# Patient Record
Sex: Female | Born: 1975
Health system: Southern US, Community
[De-identification: ages and names within clinical notes are randomized; demographics above are authoritative.]

## PROBLEM LIST (undated history)

## (undated) DIAGNOSIS — K529 Noninfective gastroenteritis and colitis, unspecified: Secondary | ICD-10-CM

## (undated) DIAGNOSIS — Z803 Family history of malignant neoplasm of breast: Secondary | ICD-10-CM

## (undated) DIAGNOSIS — Z800 Family history of malignant neoplasm of digestive organs: Secondary | ICD-10-CM

## (undated) DIAGNOSIS — T7840XA Allergy, unspecified, initial encounter: Secondary | ICD-10-CM

## (undated) DIAGNOSIS — G4730 Sleep apnea, unspecified: Secondary | ICD-10-CM

## (undated) HISTORY — PX: TUBAL LIGATION: SHX77

## (undated) HISTORY — DX: Allergy, unspecified, initial encounter: T78.40XA

## (undated) HISTORY — DX: Sleep apnea, unspecified: G47.30

---
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
## 1998-07-13 ENCOUNTER — Encounter: Payer: Self-pay | Admitting: *Deleted

## 1998-07-13 ENCOUNTER — Observation Stay (HOSPITAL_COMMUNITY): Admission: AD | Admit: 1998-07-13 | Discharge: 1998-07-14 | Payer: Self-pay | Admitting: *Deleted

## 1998-07-14 ENCOUNTER — Encounter: Payer: Self-pay | Admitting: *Deleted

## 1998-07-16 ENCOUNTER — Inpatient Hospital Stay (HOSPITAL_COMMUNITY): Admission: AD | Admit: 1998-07-16 | Discharge: 1998-07-16 | Payer: Self-pay | Admitting: *Deleted

## 2004-12-22 ENCOUNTER — Encounter: Admission: RE | Admit: 2004-12-22 | Discharge: 2004-12-22 | Payer: Self-pay | Admitting: Obstetrics & Gynecology

## 2005-12-18 ENCOUNTER — Inpatient Hospital Stay (HOSPITAL_COMMUNITY): Admission: AD | Admit: 2005-12-18 | Discharge: 2005-12-22 | Payer: Self-pay | Admitting: Obstetrics and Gynecology

## 2005-12-23 ENCOUNTER — Encounter: Admission: RE | Admit: 2005-12-23 | Discharge: 2006-01-22 | Payer: Self-pay | Admitting: Obstetrics and Gynecology

## 2006-01-23 ENCOUNTER — Encounter: Admission: RE | Admit: 2006-01-23 | Discharge: 2006-01-27 | Payer: Self-pay | Admitting: Obstetrics and Gynecology

## 2006-01-28 ENCOUNTER — Other Ambulatory Visit: Admission: RE | Admit: 2006-01-28 | Discharge: 2006-01-28 | Payer: Self-pay | Admitting: Obstetrics and Gynecology

## 2008-12-17 ENCOUNTER — Inpatient Hospital Stay (HOSPITAL_COMMUNITY): Admission: AD | Admit: 2008-12-17 | Discharge: 2008-12-17 | Payer: Self-pay | Admitting: Obstetrics and Gynecology

## 2008-12-31 ENCOUNTER — Inpatient Hospital Stay (HOSPITAL_COMMUNITY): Admission: AD | Admit: 2008-12-31 | Discharge: 2008-12-31 | Payer: Self-pay | Admitting: Obstetrics and Gynecology

## 2009-01-03 ENCOUNTER — Inpatient Hospital Stay (HOSPITAL_COMMUNITY): Admission: AD | Admit: 2009-01-03 | Discharge: 2009-01-06 | Payer: Self-pay | Admitting: Obstetrics and Gynecology

## 2009-01-04 ENCOUNTER — Encounter (INDEPENDENT_AMBULATORY_CARE_PROVIDER_SITE_OTHER): Payer: Self-pay | Admitting: Obstetrics and Gynecology

## 2010-02-21 ENCOUNTER — Encounter: Admission: RE | Admit: 2010-02-21 | Discharge: 2010-02-21 | Payer: Self-pay | Admitting: Family Medicine

## 2010-04-25 ENCOUNTER — Encounter: Admission: RE | Admit: 2010-04-25 | Discharge: 2010-04-25 | Payer: Self-pay | Admitting: Otolaryngology

## 2010-08-30 LAB — CBC
HCT: 35.8 % — ABNORMAL LOW (ref 36.0–46.0)
HCT: 36.7 % (ref 36.0–46.0)
Hemoglobin: 11.9 g/dL — ABNORMAL LOW (ref 12.0–15.0)
Hemoglobin: 12.3 g/dL (ref 12.0–15.0)
MCHC: 34.6 g/dL (ref 30.0–36.0)
MCHC: 35.5 g/dL (ref 30.0–36.0)
MCV: 92.7 fL (ref 78.0–100.0)
Platelets: 210 10*3/uL (ref 150–400)
RBC: 3.66 MIL/uL — ABNORMAL LOW (ref 3.87–5.11)
RBC: 3.81 MIL/uL — ABNORMAL LOW (ref 3.87–5.11)
RBC: 3.89 MIL/uL (ref 3.87–5.11)
RDW: 13.4 % (ref 11.5–15.5)
RDW: 13.6 % (ref 11.5–15.5)
RDW: 13.6 % (ref 11.5–15.5)
WBC: 14.1 10*3/uL — ABNORMAL HIGH (ref 4.0–10.5)

## 2010-08-30 LAB — URINALYSIS, ROUTINE W REFLEX MICROSCOPIC
Bilirubin Urine: NEGATIVE
Bilirubin Urine: NEGATIVE
Glucose, UA: NEGATIVE mg/dL
Hgb urine dipstick: NEGATIVE
Hgb urine dipstick: NEGATIVE
Ketones, ur: NEGATIVE mg/dL
Ketones, ur: NEGATIVE mg/dL
Nitrite: NEGATIVE
Nitrite: NEGATIVE
Protein, ur: NEGATIVE mg/dL
Protein, ur: NEGATIVE mg/dL
Specific Gravity, Urine: 1.01 (ref 1.005–1.030)
Urobilinogen, UA: 0.2 mg/dL (ref 0.0–1.0)
Urobilinogen, UA: 0.2 mg/dL (ref 0.0–1.0)
pH: 6 (ref 5.0–8.0)

## 2010-08-30 LAB — COMPREHENSIVE METABOLIC PANEL
ALT: 17 U/L (ref 0–35)
ALT: 19 U/L (ref 0–35)
AST: 19 U/L (ref 0–37)
Albumin: 2.9 g/dL — ABNORMAL LOW (ref 3.5–5.2)
Alkaline Phosphatase: 110 U/L (ref 39–117)
Alkaline Phosphatase: 112 U/L (ref 39–117)
BUN: 7 mg/dL (ref 6–23)
BUN: 8 mg/dL (ref 6–23)
CO2: 21 mEq/L (ref 19–32)
CO2: 23 mEq/L (ref 19–32)
Calcium: 8.7 mg/dL (ref 8.4–10.5)
Calcium: 9.6 mg/dL (ref 8.4–10.5)
Chloride: 107 mEq/L (ref 96–112)
Creatinine, Ser: 0.55 mg/dL (ref 0.4–1.2)
GFR calc Af Amer: >60 mL/min (ref >60)
GFR calc non Af Amer: >60 mL/min (ref >60)
GFR calc non Af Amer: >60 mL/min (ref >60)
Glucose, Bld: 76 mg/dL (ref 70–99)
Glucose, Bld: 79 mg/dL (ref 70–99)
Potassium: 3.6 mEq/L (ref 3.5–5.1)
Potassium: 3.6 mEq/L (ref 3.5–5.1)
Sodium: 137 mEq/L (ref 135–145)
Sodium: 138 mEq/L (ref 135–145)
Sodium: 138 mEq/L (ref 135–145)
Total Bilirubin: 0.3 mg/dL (ref 0.3–1.2)
Total Protein: 5.5 g/dL — ABNORMAL LOW (ref 6.0–8.3)
Total Protein: 5.7 g/dL — ABNORMAL LOW (ref 6.0–8.3)
Total Protein: 5.8 g/dL — ABNORMAL LOW (ref 6.0–8.3)

## 2010-08-30 LAB — URIC ACID
Uric Acid, Serum: 6.4 mg/dL (ref 2.4–7.0)
Uric Acid, Serum: 6.5 mg/dL (ref 2.4–7.0)

## 2010-08-30 LAB — LACTATE DEHYDROGENASE: LDH: 108 U/L (ref 94–250)

## 2010-08-30 LAB — RPR: RPR Ser Ql: NONREACTIVE

## 2010-08-30 LAB — URINE MICROSCOPIC-ADD ON

## 2010-08-30 LAB — CCBB MATERNAL DONOR DRAW

## 2010-08-31 LAB — URINALYSIS, ROUTINE W REFLEX MICROSCOPIC
Glucose, UA: NEGATIVE mg/dL
Leukocytes, UA: NEGATIVE
Nitrite: NEGATIVE
Specific Gravity, Urine: 1.02 (ref 1.005–1.030)
pH: 7 (ref 5.0–8.0)

## 2010-08-31 LAB — URINE MICROSCOPIC-ADD ON

## 2010-08-31 LAB — LACTATE DEHYDROGENASE: LDH: 216 U/L (ref 94–250)

## 2010-08-31 LAB — COMPREHENSIVE METABOLIC PANEL
ALT: 22 U/L (ref 0–35)
AST: 24 U/L (ref 0–37)
Albumin: 2.8 g/dL — ABNORMAL LOW (ref 3.5–5.2)
Calcium: 9.1 mg/dL (ref 8.4–10.5)
GFR calc Af Amer: >60 mL/min (ref >60)
Glucose, Bld: 120 mg/dL — ABNORMAL HIGH (ref 70–99)
Sodium: 138 mEq/L (ref 135–145)
Total Protein: 5.7 g/dL — ABNORMAL LOW (ref 6.0–8.3)

## 2010-08-31 LAB — CBC
MCHC: 35.4 g/dL (ref 30.0–36.0)
Platelets: 238 10*3/uL (ref 150–400)
RDW: 13.4 % (ref 11.5–15.5)

## 2010-10-07 NOTE — Op Note (Signed)
[name], [name]               ACCOUNT NO.:  [id number]   MEDICAL RECORD NO.:  [id number]          PATIENT TYPE:  INP   LOCATION:  NA                            FACILITY:  [facility]   PHYSICIAN:  Leighton Roach Meisinger, M.D.DATE OF BIRTH:  12-17-1975   DATE OF PROCEDURE:  01/04/2009  DATE OF DISCHARGE:                               OPERATIVE REPORT   PREOPERATIVE DIAGNOSES:  Intrauterine pregnancy at 38 plus weeks,  gestational hypertension, previous cesarean section, and desires  surgical sterility.   POSTOPERATIVE DIAGNOSES:  Intrauterine pregnancy at 38 plus weeks,  gestational hypertension, previous cesarean section, and desires  surgical sterility.   PROCEDURE:  Repeat low transverse cesarean section and bilateral partial  salpingectomy.   SURGEON:  Zenaida Niece, MD   ANESTHESIA:  Spinal.   FINDINGS:  The patient had normal gravid and anatomy, delivered a viable  female infant with Apgars of 8 and 9, weight 7 pounds 0 ounces.   SPECIMENS:  Placenta sent for cord blood donation and portions of  bilateral fallopian tubes went to Pathology.   ESTIMATED BLOOD LOSS:  800 mL.   COMPLICATIONS:  None.   PROCEDURE IN DETAIL:  The patient was taken to the operating room and  placed in the sitting position.  Dr. Malen Gauze instilled spinal anesthesia.  She was then placed in the dorsal supine position with a left lateral  tilt.  Abdomen was prepped and draped in the usual sterile fashion and a  Foley catheter was placed.  The level of her anesthesia was found to be  adequate.  Abdomen was entered via a standard Pfannenstiel incision  through her previous scar.  Once the uterus was exposed, the Alexis  disposable self-retaining retractor was placed.  A 4-cm transverse  incision was then made in the lower uterine segment pushing the bladder  inferior.  Once the uterine cavity was entered, the incision was  extended digitally.  Membranes were then ruptured revealing clear fluid.  The  fetal vertex was grasped and delivered through the incision  atraumatically.  Mouth and nares were suctioned.  The remainder of the  infant then delivered atraumatically.  Cord was doubly clamped and cut,  and the infant handed to the awaiting pediatric team.  Placenta  delivered spontaneously and was sent for cord blood donation.  Uterus  was wiped dry with a clean lap pad and all clots and debris were  removed.  Uterine incision was inspected and found to be free of  extensions.  Uterine incision was closed in one layer with a running  locking layer with #1 chromic with adequate closure and adequate  hemostasis.  Bleeding from the serosal edges was controlled with  electrocautery.   Attention was turned to tubal ligation.  Both fallopian tubes were  identified and traced to their fimbriated ends.  The middle portion of  each fallopian tube was elevated with a Babcock clamp.  A window was  made in an avascular portion of the mesosalpinx.  Zero plain gut suture  was passed through the window, tied on one side, and then wrapped on the  other side of the tube to make a knuckle of tube.  The knuckle of tube  was then removed sharply.  On both sides, both tubal ostia were  identified and the stumps were hemostatic.  Uterine incision was again  inspected and found to be free of extensions.  The Alexis retractor was  removed.  The subfascial space was irrigated and made hemostatic with  electrocautery.  Fascia was closed in running fashion starting at both  ends and meeting in the middle with 0 Vicryl.  Subcutaneous tissue was  then irrigated and made hemostatic with electrocautery.  Subcutaneous  tissue was closed with running 2-0 plain gut suture.  Skin was then  closed with staples followed by a sterile dressing.  The patient  tolerated the procedure well and was taken to the recovery room in  stable condition.  Counts were correct.  She received Ancef 1 g IV at  the beginning of the  procedure and had PAS hose on throughout the  procedure.      Zenaida Niece, M.D.  Electronically Signed     TDM/MEDQ  D:  01/04/2009  T:  01/04/2009  Job:  [id number]

## 2010-10-07 NOTE — Discharge Summary (Signed)
[name], [name]               ACCOUNT NO.:  [id number]   MEDICAL RECORD NO.:  [id number]          PATIENT TYPE:  INP   LOCATION:  9144                          FACILITY:  [facility]   PHYSICIAN:  Zenaida Niece, M.D.DATE OF BIRTH:  10/25/1975   DATE OF ADMISSION:  01/03/2009  DATE OF DISCHARGE:  01/06/2009                               DISCHARGE SUMMARY   ADMISSION DIAGNOSES:  1. Intrauterine pregnancy at 38+ weeks.  2. Gestational hypertension.  3. Previous cesarean section and desires surgical sterility.   DISCHARGE DIAGNOSES:  1. Intrauterine pregnancy at 38+ weeks.  2. Gestational hypertension.  3. Previous cesarean section and desires surgical sterility.   PROCEDURES:  On January 04, 2009, she underwent repeat low-transverse  cesarean section and bilateral tubal ligation.   HISTORY AND PHYSICAL:  Please see the chart for the full dictated  history and physical.  Briefly, this is a 35 year old gravida 2, para 1-  0-0-1 with an EGA of 38+ weeks with persistently elevated blood  pressures in the office who is being admitted for repeat cesarean  section.  Prenatal care has otherwise been uncomplicated except for  history of a prior cesarean section for which she is scheduled for  repeat.  She also wants her tubes tied.  Physical exam significant for  blood pressures in the office of 150/110.  Abdomen is gravid, nontender  with fundal height of 39 cm and well-healed transverse scar.  Cervix is  1, 50, -3 and vertex.   HOSPITAL COURSE:  The patient was admitted on the evening of August 12.  PIH labs were normal and no significant proteinuria.  NST was reactive.  Blood pressures remained labile 130-150/80-100.  On the late morning of  August 13, she was taken to the operating room for repeat cesarean  section and tubal ligation.  This was done under spinal anesthesia  without complications.  She delivered a viable female infant with Apgars  of 8 and 9 and weighed 7 pounds.   Estimated blood loss was 800 mL.  Postoperatively, she had no significant complications.  Blood pressure  remained labile on postoperative #1 at 130-160/70s to 90s.  Predelivery  hemoglobin 12.5, postdelivery 11.9.  On postoperative day #2, blood  pressures were stable.  She was otherwise doing well and she requested  discharge home.  She was felt to be stable enough for discharge home.  Her incision was healing well.   DISCHARGE INSTRUCTIONS:  Regular diet, pelvic rest, no strenuous  activity.  Followup is in 2-3 days for staple removal.  Medications are  Percocet #40 1-2 p.o. q.4-6 hours p.r.n. pain and over-the-counter  ibuprofen as needed and she was given our discharge pamphlet.     Zenaida Niece, M.D.  Electronically Signed    TDM/MEDQ  D:  01/06/2009  T:  01/06/2009  Job:  [id number]

## 2010-10-07 NOTE — H&P (Signed)
[name], HAUSNER               ACCOUNT NO.:  [id number]   MEDICAL RECORD NO.:  [id number]          PATIENT TYPE:  INP   LOCATION:  9175                          FACILITY:  [facility]   PHYSICIAN:  Zenaida Niece, M.D.DATE OF BIRTH:  04/03/1976   DATE OF ADMISSION:  01/03/2009  DATE OF DISCHARGE:                              HISTORY & PHYSICAL   CHIEF COMPLAINT:  Gestational hypertension.   HISTORY OF PRESENT ILLNESS:  This is a 35 year old gravida 2, para 1-0-0-  1 with an EGA of 38+ weeks by an LMP consistent with a 7-week ultrasound  with a due date of January 16, 2009 who presents to the office today with  complaint of mild headache and increased swelling.  She has been  followed for elevated blood pressures with normal labs.  However, she  feels worse today and her edema is worse.  She was evaluated in  maternity admissions earlier in this week.  Apparently, she had a 24-  hour urine collection done, which she turned in yesterday morning, which  we do not have results of yet.  She has a prior cesarean section and is  scheduled for repeat cesarean section on January 11, 2009.  However, due  to her blood pressure of 152/110 in the office today and repeated  elevated blood pressures in the office, I do not feel she is going to  make it that far.  She is being admitted for lab evaluation, nonstress  test, and evaluation of her blood pressures in preparation for cesarean  section probably on the January 04, 2009.  Prenatal care has been  otherwise uncomplicated.  Prenatal labs, RPR is nonreactive, hepatitis B  surface antigen negative, rubella immune, HIV negative, blood type is O+  with negative antibody screen.  Gonorrhea and chlamydia are negative.  Cystic fibrosis negative.  Group B strep is negative.  A 1-hour Glucola  is 103.   PAST OBSTETRICAL HISTORY:  In 2007 low transverse cesarean section at 40  weeks done for arrest of descent.  Baby weighed 8 pounds 2 ounces.   PAST  GYNECOLOGICAL HISTORY:  History of a low grade SIL Pap smear with  normal colposcopy and normal since then.   PAST MEDICAL HISTORY:  Negative.   PAST SURGICAL HISTORY:  Significant only for the cesarean section.   ALLERGIES:  To SULFA, which causes rash, hives, and edema.   CURRENT MEDICATIONS:  None.   SOCIAL HISTORY:  She is married and did smoke prior to pregnancy, but  not during pregnancy.   FAMILY HISTORY:  Significant for history of breast cancer in her mother   REVIEW OF SYSTEMS:  She does have a mild headache, but no vision change  upper abdominal pain.  She does have increased swelling and no  significant nausea or vomiting.   PHYSICAL EXAMINATION:  GENERAL:  This is a well-developed gravid female,  in no acute distress.  VITAL SIGNS:  Blood pressure in the office was 152/110.  Urine protein  is negative in the office.  NECK:  Supple without lymphadenopathy or thyromegaly.  LUNGS:  Clear to  auscultation.  HEART:  Regular rate and rhythm without murmur.  ABDOMEN:  Gravid, nontender with a fundal height of 39 cm and she has a  well-healed transverse scar.  EXTREMITIES:  2+ edema.  DTRs are 1+/4 and she has no clonus.  Cervix is 1, 50, -3 and vertex presentation.   ASSESSMENT:  Intrauterine pregnancy at 38+ weeks.  1. Previous cesarean section.  The patient is cleared for a trial of      labor, but declines this and wants repeat cesarean section.  This      is scheduled for January 11, 2009, but will be done earlier due to      gestational hypertension.  2. Gestational hypertension/possible preeclampsia.  Blood pressures in      the office remained elevated, although her labs to this point have      not been significantly abnormal.  She has a 24-hour urine result      that is pending.   PLAN:  To admit the patient to Antenatal or Labor and Delivery for  bedrest and check serial blood pressures, labs and an NST.  If she is  stable tonight then I will do her C-section  tomorrow on the January 04, 2009.  If anything is significantly abnormal tonight then we will  proceed with C-section tonight.      Zenaida Niece, M.D.  Electronically Signed     TDM/MEDQ  D:  01/03/2009  T:  01/04/2009  Job:  [id number]

## 2010-10-10 NOTE — Op Note (Signed)
[name], [name]               ACCOUNT NO.:  [id number]   MEDICAL RECORD NO.:  [id number]          PATIENT TYPE:  INP   LOCATION:  9173                          FACILITY:  [facility]   PHYSICIAN:  Zenaida Niece, M.D.DATE OF BIRTH:  [birth year]   DATE OF PROCEDURE:  12/19/2005  DATE OF DISCHARGE:                                 OPERATIVE REPORT   PREOPERATIVE DIAGNOSES:  Intrauterine pregnancy at 39 weeks and arrest of  descent.   POSTOPERATIVE DIAGNOSES:  Intrauterine pregnancy at 39 weeks and arrest of  descent.   PROCEDURE:  Primary low transverse cesarean section.   SURGEON:  Zenaida Niece, M.D.   ANESTHESIA:  Epidural.   ESTIMATED BLOOD LOSS:  1000 cc.   COMPLICATIONS:  None.   FINDINGS:  She had normal anatomy and delivered a viable female infant, that  was in the OP position and had Apgars of 3 and 9; that weighed  8 pounds 3  ounces, and there was a nuchal cord x2.   PROCEDURE IN DETAIL:  The patient was taken to the operating room and placed  in the dorsal supine position with a left lateral tilt.  Her previously  placed epidural was dosed appropriately.  Abdomen was prepped and draped in  the usual sterile fashion.  A Foley catheter had previously been placed.  The level of her anesthesia was found to be adequate and the abdomen was  entered via a standard Pfannenstiel's incision.  A disposable self-retaining  retractor was placed and a 4 cm transverse incision was made in the lower  uterine segment.  The incision was extended bilaterally digitally.  The  fetal vertex was grasped and moved up out of the pelvis, and delivered  through the incision atraumatically.  Mouth and nares were suctioned.  The  remainder of the infant then delivered atraumatically.  The cord was doubly  clamped and cut, and the infant handed to the awaiting pediatric team.  Cord  blood was obtained and the placenta delivered spontaneously.  The uterus was  wiped dry with a clean lap pad,  and all clots and debris were removed.  The  uterine incision was inspected and found to be free of extensions.  The  uterine incision was closed in two layers, with the first layer being  running locking layer with #1 chromic, and the second layer being an  imbricating layer with #1 chromic.  A small amount of bleeding from a suture  hole on the right side was controlled with 3-0 Vicryl.  Tubes and ovaries  were inspected and found to be normal.  The uterine incision was again  inspected and found to be hemostatic.  Subfascial space was then irrigated  and made hemostatic with electrocautery.  Fascia was closed in a running  fashion, starting at both ends and meeting in the middle with 0  Vicryl.  Subcutaneous tissue was then irrigated and made hemostatic with  electrocautery.  The skin was then closed with staples, followed by a  sterile dressing.  The patient tolerated the procedure well and was taken to  the recovery  room in stable condition.  Counts were correct and she received  Ancef  1 gram after cord clamp.      Zenaida Niece, M.D.  Electronically Signed     TDM/MEDQ  D:  12/19/2005  T:  12/19/2005  Job:  [id number]

## 2010-10-10 NOTE — Discharge Summary (Signed)
[name], LABARBERA               ACCOUNT NO.:  [id number]   MEDICAL RECORD NO.:  [id number]          PATIENT TYPE:  INP   LOCATION:  9123                          [facility]ILITY[facility]   PHYSICIAN:  Zenaida Niece, M.D.DATE OF BIRTH:  [birth year]   DATE OF ADMISSION:  12/18/2005  DATE OF DISCHARGE:  12/22/2005                                 DISCHARGE SUMMARY   ADMISSION DIAGNOSIS:  Intrauterine pregnancy at 40 weeks.   DISCHARGE DIAGNOSES:  1. Intrauterine pregnancy at 40 weeks.  2. Arrest of descent.   PROCEDURES:  On July 28 she had a primary low transverse cesarean section.   HISTORY AND PHYSICAL:  This is a 35 year old white female gravida 1, para 0  with an EGA of [redacted] weeks who presents for elective induction.  Prenatal care  uncomplicated.   PRENATAL LABORATORIES:  Blood type is O+ with a negative antibody screen,  RPR nonreactive, rubella immune, hepatitis B surface antigen negative,  gonorrhea and chlamydia negative, one-hour Glucola 121, group B Strep is  negative.   Past history is essentially noncontributory.   PHYSICAL EXAMINATION:  GENERAL:  She is afebrile with stable vital signs.  Fetal heart tracing is reactive with irregular contractions.  ABDOMEN:  Gravid, nontender with an estimated fetal weight of 8 pounds.  PELVIC:  Cervix is 2-3, 80, -1 vertex presentation, and an adequate pelvis.   HOSPITAL COURSE:  Patient was admitted and was contracting on her own.  She  had spontaneous rupture of membranes and progressed in active labor.  She  received an epidural and continued to progress.  However, she did have a  protracted course and reached a rim dilated.  She was able to push past  this, but was not able to bring the baby past +2 station.  Thus, on the  afternoon of July 28 she was taken for a primary low transverse cesarean  section under epidural anesthesia.  Estimated blood loss was 1000 mL.  The  patient had normal anatomy and delivered a viable female infant  that was in  the direct OP position and had a nuchal cord x2.  Apgars were 3 and 9 and  weight was 8 pounds 2 ounces.  Postoperatively she had no significant  complications.  Pre delivery hemoglobin 13.3 and post delivery 9.4.  On  postoperative day #3 she was felt to be stable enough for discharge home.  Her incision was healing well and prior to discharge her staples were to be  removed and Steri-Strips applied.   DISCHARGE INSTRUCTIONS:  Regular diet, pelvic rest, no strenuous activity.  Follow-up is in two weeks for an incision check.  Medications are Percocet  #40 one to two p.o. q.4-6h. p.r.n. pain and over-the-counter ibuprofen as  needed and she is given our discharge pamphlet.      Zenaida Niece, M.D.  Electronically Signed     TDM/MEDQ  D:  12/22/2005  T:  12/22/2005  Job:  [id number]

## 2014-11-05 DIAGNOSIS — Z9889 Other specified postprocedural states: Secondary | ICD-10-CM | POA: Insufficient documentation

## 2014-11-29 ENCOUNTER — Other Ambulatory Visit: Payer: Self-pay | Admitting: General Practice

## 2014-12-24 HISTORY — PX: BREAST BIOPSY: SHX20

## 2014-12-25 ENCOUNTER — Other Ambulatory Visit: Payer: Self-pay | Admitting: Obstetrics and Gynecology

## 2014-12-25 DIAGNOSIS — R928 Other abnormal and inconclusive findings on diagnostic imaging of breast: Secondary | ICD-10-CM

## 2014-12-27 ENCOUNTER — Ambulatory Visit
Admission: RE | Admit: 2014-12-27 | Discharge: 2014-12-27 | Disposition: A | Discharge status: Home / Self Care | Payer: BLUE CROSS/BLUE SHIELD | Source: Ambulatory Visit | Attending: Obstetrics and Gynecology | Admitting: Obstetrics and Gynecology

## 2014-12-27 ENCOUNTER — Other Ambulatory Visit: Payer: Self-pay | Admitting: Obstetrics and Gynecology

## 2014-12-27 DIAGNOSIS — R928 Other abnormal and inconclusive findings on diagnostic imaging of breast: Secondary | ICD-10-CM

## 2014-12-27 DIAGNOSIS — R921 Mammographic calcification found on diagnostic imaging of breast: Secondary | ICD-10-CM

## 2015-01-01 ENCOUNTER — Ambulatory Visit
Admission: RE | Admit: 2015-01-01 | Discharge: 2015-01-01 | Disposition: A | Discharge status: Home / Self Care | Payer: BLUE CROSS/BLUE SHIELD | Source: Ambulatory Visit | Attending: Obstetrics and Gynecology | Admitting: Obstetrics and Gynecology

## 2015-01-01 ENCOUNTER — Other Ambulatory Visit: Payer: Self-pay | Admitting: Obstetrics and Gynecology

## 2015-01-01 DIAGNOSIS — R921 Mammographic calcification found on diagnostic imaging of breast: Secondary | ICD-10-CM

## 2015-01-02 ENCOUNTER — Other Ambulatory Visit: Payer: Self-pay | Admitting: Obstetrics and Gynecology

## 2015-01-02 DIAGNOSIS — R921 Mammographic calcification found on diagnostic imaging of breast: Secondary | ICD-10-CM

## 2015-03-25 ENCOUNTER — Other Ambulatory Visit: Payer: Self-pay | Admitting: General Practice

## 2015-05-27 ENCOUNTER — Emergency Department (INDEPENDENT_AMBULATORY_CARE_PROVIDER_SITE_OTHER)
Admission: EM | Admit: 2015-05-27 | Discharge: 2015-05-27 | Disposition: A | Discharge status: Home / Self Care | Payer: BLUE CROSS/BLUE SHIELD | Source: Home / Self Care | Attending: Family Medicine | Admitting: Family Medicine

## 2015-05-27 ENCOUNTER — Emergency Department (INDEPENDENT_AMBULATORY_CARE_PROVIDER_SITE_OTHER): Payer: BLUE CROSS/BLUE SHIELD

## 2015-05-27 ENCOUNTER — Encounter: Payer: Self-pay | Admitting: *Deleted

## 2015-05-27 DIAGNOSIS — F17200 Nicotine dependence, unspecified, uncomplicated: Secondary | ICD-10-CM | POA: Diagnosis not present

## 2015-05-27 DIAGNOSIS — M940 Chondrocostal junction syndrome [Tietze]: Secondary | ICD-10-CM

## 2015-05-27 DIAGNOSIS — M791 Myalgia: Secondary | ICD-10-CM | POA: Diagnosis not present

## 2015-05-27 DIAGNOSIS — R079 Chest pain, unspecified: Secondary | ICD-10-CM | POA: Diagnosis not present

## 2015-05-27 DIAGNOSIS — M7918 Myalgia, other site: Secondary | ICD-10-CM

## 2015-05-27 DIAGNOSIS — R0789 Other chest pain: Secondary | ICD-10-CM | POA: Diagnosis not present

## 2015-05-27 HISTORY — DX: Noninfective gastroenteritis and colitis, unspecified: K52.9

## 2015-05-27 MED ORDER — KETOROLAC TROMETHAMINE 60 MG/2ML IM SOLN
60.0000 mg | Freq: Once | INTRAMUSCULAR | Status: AC
  Administered 2015-05-27: 60 mg via INTRAMUSCULAR

## 2015-05-27 MED ORDER — HYDROCODONE-ACETAMINOPHEN 5-325 MG PO TABS: ORAL_TABLET | ORAL | Status: DC

## 2015-05-27 MED ORDER — MELOXICAM 15 MG PO TABS: 15.0000 mg | ORAL_TABLET | Freq: Every day | ORAL | Status: DC

## 2015-05-27 NOTE — ED Notes (Signed)
Since last night, c/o CP and fluttering. Mild stomach discomfort. H/o colitis. CP not relieved by Ranitidine.

## 2015-05-27 NOTE — ED Provider Notes (Signed)
CSN: [id number]     Arrival date & time 05/27/15  1248 History   First MD Initiated Contact with Patient 05/27/15 1328     Chief Complaint  Patient presents with  . Chest Pain      HPI Comments: 48 hours ago patient developed a vague "heaviness" in her anterior chest that began to radiate to her upper mid-back between scapulae.   The pain became worse yesterday with a sensation of pressure and tightness in her anterior chest.  She had no improvement after taking ranitidine and Nexium.  No nausea/vomiting.  No cough or recent URI symptoms.   Family history of MI paternal aunt and uncle, both age mid-70  Patient is a 40 y.o. female presenting with chest pain. The history is provided by the patient.  Chest Pain Pain location:  Substernal area Pain quality: aching and pressure   Pain radiates to:  Upper back Pain radiates to the back: yes   Pain severity:  Mild Onset quality:  Gradual Duration:  2 days Timing:  Constant Progression:  Unchanged Chronicity:  New Context: movement   Relieved by:  Nothing Worsened by:  Certain positions Ineffective treatments: ranitidine. Associated symptoms: abdominal pain, back pain and palpitations   Associated symptoms: no AICD problem, no anorexia, no cough, no diaphoresis, no dizziness, no dysphagia, no fatigue, no fever, no headache, no heartburn, no lower extremity edema, no nausea, no numbness, no PND, no shortness of breath, no syncope and not vomiting   Risk factors: smoking     Past Medical History  Diagnosis Date  . Colitis    Past Surgical History  Procedure Laterality Date  . Cesarean section     Family History  Problem Relation Age of Onset  . Heart attack Other    Social History  Substance Use Topics  . Smoking status: Current Every Day Smoker    Types: Cigarettes  . Smokeless tobacco: Never Used  . Alcohol Use: Yes   OB History    No data available     Review of Systems  Constitutional: Negative for fever, diaphoresis  and fatigue.  HENT: Negative for trouble swallowing.   Respiratory: Negative for cough and shortness of breath.   Cardiovascular: Positive for chest pain and palpitations. Negative for syncope and PND.  Gastrointestinal: Positive for abdominal pain. Negative for heartburn, nausea, vomiting and anorexia.  Musculoskeletal: Positive for back pain.  Neurological: Negative for dizziness, numbness and headaches.  All other systems reviewed and are negative.   Allergies  Sulfur  Home Medications   Prior to Admission medications   Medication Sig Start Date End Date Taking? Authorizing Provider  dicyclomine (BENTYL) 20 MG tablet Take 20 mg by mouth every 6 (six) hours.   Yes Historical Provider, MD  ranitidine (ZANTAC) 150 MG tablet Take 150 mg by mouth 2 (two) times daily.   Yes Historical Provider, MD  HYDROcodone-acetaminophen (NORCO/VICODIN) 5-325 MG tablet Take one by mouth at bedtime as needed for pain 05/27/15   Kandra Nicolas, MD  meloxicam (MOBIC) 15 MG tablet Take 1 tablet (15 mg total) by mouth daily. Take with food each morning 05/27/15   Kandra Nicolas, MD   Meds Ordered and Administered this Visit   Medications  ketorolac (TORADOL) injection 60 mg (60 mg Intramuscular Given 05/27/15 1353)    BP 163/104 mmHg  Pulse 89  Temp(Src) 98.2 F (36.8 C) (Oral)  Resp 18  Wt 186 lb (84.369 kg)  SpO2 99%  LMP 05/13/2015 No data  found.   Physical Exam  Constitutional: She is oriented to person, place, and time. She appears well-developed and well-nourished. No distress.  HENT:  Head: Normocephalic.  Right Ear: External ear normal.  Left Ear: External ear normal.  Nose: Nose normal.  Mouth/Throat: No oropharyngeal exudate.  Eyes: Conjunctivae are normal. Pupils are equal, round, and reactive to light.  Neck: Neck supple.  Cardiovascular: Normal rate, regular rhythm, normal heart sounds and intact distal pulses.   Pulmonary/Chest: Effort normal and breath sounds normal. She has no  wheezes. She has no rales.   She exhibits tenderness.    Chest:  Distinct tenderness to palpation over the mid-sternum and pectoralis muscles.  Her pain is recreated by palpating her sternum and pectoralis muscles during resisted contraction of the pectoralis muscles.  There is distinct tenderness over medial edge of left scapula.  Pain is elicited by resisted abduction of left shoulder while palpating left rhomboid muscles.  There is milder tenderness along the medial edge of the right scapula.  Abdominal: Soft. Bowel sounds are normal. There is no tenderness.  Musculoskeletal: She exhibits no edema.  Lymphadenopathy:    She has no cervical adenopathy.  Neurological: She is alert and oriented to person, place, and time.  Skin: Skin is warm and dry. No rash noted. She is not diaphoretic.  Nursing note and vitals reviewed.   ED Course  Procedures none    Labs Reviewed -    EKG: Rate:  70 BPM PR:  128 msec QT:  408 msec QTcH:  440 msec QRSD:  98 msec QRS axis:  62 degrees Interpretation:  Normal sinus rhythm; within normal limits.  No acute changes   Imaging Review Dg Chest 2 View  05/27/2015  CLINICAL DATA:  Pressure and tightness for 3 days.  Smoker. EXAM: CHEST  2 VIEW COMPARISON:  None. FINDINGS: The heart size and mediastinal contours are within normal limits. Both lungs are clear. The visualized skeletal structures are unremarkable. IMPRESSION: No active cardiopulmonary disease. Electronically Signed   By: Staci Righter M.D.   On: 05/27/2015 14:18      MDM   1. Costochondritis   2. Rhomboid muscle pain    Toradol 60mg  IM.  Begin Mobic15mg  daily tomorrow Rx for Lortab at bedtime prn. Apply ice pack for 20 to 30 minutes, 3 to 4 times daily  Continue until pain decreases.  Begin range of motion and stretching exercises as tolerated.  May take Tylenol daytime for pain. If symptoms become significantly worse during the night or over the weekend, proceed to the local  emergency room.  Followup with Family Doctor if not improved in one week.     Kandra Nicolas, MD 06/01/15 [phone]

## 2015-05-27 NOTE — Discharge Instructions (Signed)
Apply ice pack for 20 to 30 minutes, 3 to 4 times daily  Continue until pain decreases.  Begin range of motion and stretching exercises as tolerated.  May take Tylenol daytime for pain. If symptoms become significantly worse during the night or over the weekend, proceed to the local emergency room.    Costochondritis Costochondritis, sometimes called Tietze syndrome, is a swelling and irritation (inflammation) of the tissue (cartilage) that connects your ribs with your breastbone (sternum). It causes pain in the chest and rib area. Costochondritis usually goes away on its own over time. It can take up to 6 weeks or longer to get better, especially if you are unable to limit your activities. CAUSES  Some cases of costochondritis have no known cause. Possible causes include:  Injury (trauma).  Exercise or activity such as lifting.  Severe coughing. SIGNS AND SYMPTOMS  Pain and tenderness in the chest and rib area.  Pain that gets worse when coughing or taking deep breaths.  Pain that gets worse with specific movements. DIAGNOSIS  Your health care provider will do a physical exam and ask about your symptoms. Chest X-rays or other tests may be done to rule out other problems. TREATMENT  Costochondritis usually goes away on its own over time. Your health care provider may prescribe medicine to help relieve pain. HOME CARE INSTRUCTIONS   Avoid exhausting physical activity. Try not to strain your ribs during normal activity. This would include any activities using chest, abdominal, and side muscles, especially if heavy weights are used.  Apply ice to the affected area for the first 2 days after the pain begins.  Put ice in a plastic bag.  Place a towel between your skin and the bag.  Leave the ice on for 20 minutes, 2-3 times a day.  Only take over-the-counter or prescription medicines as directed by your health care provider. SEEK MEDICAL CARE IF:  You have redness or swelling at  the rib joints. These are signs of infection.  Your pain does not go away despite rest or medicine. SEEK IMMEDIATE MEDICAL CARE IF:   Your pain increases or you are very uncomfortable.  You have shortness of breath or difficulty breathing.  You cough up blood.  You have worse chest pains, sweating, or vomiting.  You have a fever or persistent symptoms for more than 2-3 days.  You have a fever and your symptoms suddenly get worse. MAKE SURE YOU:   Understand these instructions.  Will watch your condition.  Will get help right away if you are not doing well or get worse.   This information is not intended to replace advice given to you by your health care provider. Make sure you discuss any questions you have with your health care provider.   Document Released: 02/18/2005 Document Revised: 03/01/2013 Document Reviewed: 12/13/2012 Elsevier Interactive Patient Education [employer].

## 2015-06-02 ENCOUNTER — Telehealth: Payer: Self-pay | Admitting: Emergency Medicine

## 2016-03-17 ENCOUNTER — Other Ambulatory Visit: Payer: Self-pay | Admitting: Obstetrics and Gynecology

## 2016-03-17 DIAGNOSIS — Z1231 Encounter for screening mammogram for malignant neoplasm of breast: Secondary | ICD-10-CM

## 2016-04-10 ENCOUNTER — Ambulatory Visit
Admission: RE | Admit: 2016-04-10 | Discharge: 2016-04-10 | Disposition: A | Discharge status: Home / Self Care | Payer: BLUE CROSS/BLUE SHIELD | Source: Ambulatory Visit | Attending: Obstetrics and Gynecology | Admitting: Obstetrics and Gynecology

## 2016-04-10 DIAGNOSIS — Z1231 Encounter for screening mammogram for malignant neoplasm of breast: Secondary | ICD-10-CM

## 2017-03-19 ENCOUNTER — Other Ambulatory Visit: Payer: Self-pay | Admitting: Obstetrics and Gynecology

## 2017-03-19 DIAGNOSIS — Z1231 Encounter for screening mammogram for malignant neoplasm of breast: Secondary | ICD-10-CM

## 2017-04-06 DIAGNOSIS — N760 Acute vaginitis: Secondary | ICD-10-CM | POA: Diagnosis not present

## 2017-04-06 DIAGNOSIS — N898 Other specified noninflammatory disorders of vagina: Secondary | ICD-10-CM | POA: Diagnosis not present

## 2017-04-06 DIAGNOSIS — Z1889 Other specified retained foreign body fragments: Secondary | ICD-10-CM | POA: Diagnosis not present

## 2017-04-23 ENCOUNTER — Ambulatory Visit
Admission: RE | Admit: 2017-04-23 | Discharge: 2017-04-23 | Disposition: A | Discharge status: Home / Self Care | Payer: BLUE CROSS/BLUE SHIELD | Source: Ambulatory Visit | Attending: Obstetrics and Gynecology | Admitting: Obstetrics and Gynecology

## 2017-04-23 DIAGNOSIS — Z1231 Encounter for screening mammogram for malignant neoplasm of breast: Secondary | ICD-10-CM | POA: Diagnosis not present

## 2017-04-26 ENCOUNTER — Other Ambulatory Visit: Payer: Self-pay | Admitting: Obstetrics and Gynecology

## 2017-04-26 DIAGNOSIS — R928 Other abnormal and inconclusive findings on diagnostic imaging of breast: Secondary | ICD-10-CM

## 2017-04-30 ENCOUNTER — Ambulatory Visit
Admission: RE | Admit: 2017-04-30 | Discharge: 2017-04-30 | Disposition: A | Discharge status: Home / Self Care | Payer: BLUE CROSS/BLUE SHIELD | Source: Ambulatory Visit | Attending: Obstetrics and Gynecology | Admitting: Obstetrics and Gynecology

## 2017-04-30 ENCOUNTER — Ambulatory Visit: Payer: BLUE CROSS/BLUE SHIELD

## 2017-04-30 DIAGNOSIS — R922 Inconclusive mammogram: Secondary | ICD-10-CM | POA: Diagnosis not present

## 2017-04-30 DIAGNOSIS — R928 Other abnormal and inconclusive findings on diagnostic imaging of breast: Secondary | ICD-10-CM

## 2017-06-18 DIAGNOSIS — K635 Polyp of colon: Secondary | ICD-10-CM | POA: Diagnosis not present

## 2017-06-18 DIAGNOSIS — Z86010 Personal history of colonic polyps: Secondary | ICD-10-CM | POA: Diagnosis not present

## 2017-11-10 DIAGNOSIS — Q72812 Congenital shortening of left lower limb: Secondary | ICD-10-CM | POA: Diagnosis not present

## 2017-11-10 DIAGNOSIS — M5136 Other intervertebral disc degeneration, lumbar region: Secondary | ICD-10-CM | POA: Diagnosis not present

## 2017-11-10 DIAGNOSIS — M9905 Segmental and somatic dysfunction of pelvic region: Secondary | ICD-10-CM | POA: Diagnosis not present

## 2017-11-10 DIAGNOSIS — M9903 Segmental and somatic dysfunction of lumbar region: Secondary | ICD-10-CM | POA: Diagnosis not present

## 2017-12-17 DIAGNOSIS — L821 Other seborrheic keratosis: Secondary | ICD-10-CM | POA: Diagnosis not present

## 2017-12-17 DIAGNOSIS — L814 Other melanin hyperpigmentation: Secondary | ICD-10-CM | POA: Diagnosis not present

## 2017-12-17 DIAGNOSIS — L820 Inflamed seborrheic keratosis: Secondary | ICD-10-CM | POA: Diagnosis not present

## 2017-12-17 DIAGNOSIS — D225 Melanocytic nevi of trunk: Secondary | ICD-10-CM | POA: Diagnosis not present

## 2017-12-17 DIAGNOSIS — D1801 Hemangioma of skin and subcutaneous tissue: Secondary | ICD-10-CM | POA: Diagnosis not present

## 2018-02-03 DIAGNOSIS — M47816 Spondylosis without myelopathy or radiculopathy, lumbar region: Secondary | ICD-10-CM | POA: Diagnosis not present

## 2018-02-04 DIAGNOSIS — Z1389 Encounter for screening for other disorder: Secondary | ICD-10-CM | POA: Diagnosis not present

## 2018-02-04 DIAGNOSIS — Z6825 Body mass index (BMI) 25.0-25.9, adult: Secondary | ICD-10-CM | POA: Diagnosis not present

## 2018-02-04 DIAGNOSIS — Z130 Encounter for screening for diseases of the blood and blood-forming organs and certain disorders involving the immune mechanism: Secondary | ICD-10-CM | POA: Diagnosis not present

## 2018-02-04 DIAGNOSIS — Z01419 Encounter for gynecological examination (general) (routine) without abnormal findings: Secondary | ICD-10-CM | POA: Diagnosis not present

## 2018-04-04 ENCOUNTER — Other Ambulatory Visit: Payer: Self-pay | Admitting: Obstetrics and Gynecology

## 2018-04-04 DIAGNOSIS — Z1231 Encounter for screening mammogram for malignant neoplasm of breast: Secondary | ICD-10-CM

## 2018-05-13 ENCOUNTER — Telehealth: Payer: Self-pay | Admitting: Physician Assistant

## 2018-05-13 ENCOUNTER — Ambulatory Visit: Payer: BLUE CROSS/BLUE SHIELD | Admitting: Physician Assistant

## 2018-05-13 NOTE — Telephone Encounter (Signed)
Provider approved rescheduling patient's appointment to establish care.

## 2018-05-13 NOTE — Telephone Encounter (Signed)
-----   Message from [place], [place] sent at 05/13/2018  9:23 AM EST ----- Regarding: RE: Reschedule NP Appt Yes. ----- Message ----- From: Janeece Fitting Sent: 05/13/2018   8:58 AM EST To: Inda Coke, PA Subject: Reschedule NP Appt                             Sam,  Can patient reschedule appointment? She was sick today and cancelled.  Thanks

## 2018-05-27 ENCOUNTER — Ambulatory Visit
Admission: RE | Admit: 2018-05-27 | Discharge: 2018-05-27 | Disposition: A | Discharge status: Home / Self Care | Payer: BLUE CROSS/BLUE SHIELD | Source: Ambulatory Visit | Attending: Obstetrics and Gynecology | Admitting: Obstetrics and Gynecology

## 2018-05-27 DIAGNOSIS — Z1231 Encounter for screening mammogram for malignant neoplasm of breast: Secondary | ICD-10-CM

## 2018-05-30 ENCOUNTER — Other Ambulatory Visit: Payer: Self-pay | Admitting: Obstetrics and Gynecology

## 2018-05-30 DIAGNOSIS — R928 Other abnormal and inconclusive findings on diagnostic imaging of breast: Secondary | ICD-10-CM

## 2018-06-01 ENCOUNTER — Ambulatory Visit
Admission: RE | Admit: 2018-06-01 | Discharge: 2018-06-01 | Disposition: A | Discharge status: Home / Self Care | Payer: BLUE CROSS/BLUE SHIELD | Source: Ambulatory Visit | Attending: Obstetrics and Gynecology | Admitting: Obstetrics and Gynecology

## 2018-06-01 DIAGNOSIS — N6012 Diffuse cystic mastopathy of left breast: Secondary | ICD-10-CM | POA: Diagnosis not present

## 2018-06-01 DIAGNOSIS — R928 Other abnormal and inconclusive findings on diagnostic imaging of breast: Secondary | ICD-10-CM

## 2018-06-01 DIAGNOSIS — R921 Mammographic calcification found on diagnostic imaging of breast: Secondary | ICD-10-CM | POA: Diagnosis not present

## 2019-01-17 ENCOUNTER — Other Ambulatory Visit: Payer: Self-pay | Admitting: *Deleted

## 2019-01-17 ENCOUNTER — Encounter: Payer: Self-pay | Admitting: *Deleted

## 2019-01-18 ENCOUNTER — Other Ambulatory Visit: Payer: Self-pay

## 2019-01-18 ENCOUNTER — Encounter: Payer: Self-pay | Admitting: Family Medicine

## 2019-01-18 ENCOUNTER — Ambulatory Visit (INDEPENDENT_AMBULATORY_CARE_PROVIDER_SITE_OTHER): Payer: BC Managed Care – PPO | Admitting: Family Medicine

## 2019-01-18 VITALS — BP 126/78 | HR 98 | Temp 98.4°F | Resp 16 | Ht 70.0 in | Wt 170.6 lb

## 2019-01-18 DIAGNOSIS — Z800 Family history of malignant neoplasm of digestive organs: Secondary | ICD-10-CM | POA: Diagnosis not present

## 2019-01-18 DIAGNOSIS — Z0000 Encounter for general adult medical examination without abnormal findings: Secondary | ICD-10-CM

## 2019-01-18 DIAGNOSIS — Z8041 Family history of malignant neoplasm of ovary: Secondary | ICD-10-CM

## 2019-01-18 DIAGNOSIS — Z803 Family history of malignant neoplasm of breast: Secondary | ICD-10-CM

## 2019-01-18 DIAGNOSIS — Z23 Encounter for immunization: Secondary | ICD-10-CM

## 2019-01-18 DIAGNOSIS — F17210 Nicotine dependence, cigarettes, uncomplicated: Secondary | ICD-10-CM

## 2019-01-18 HISTORY — DX: Family history of malignant neoplasm of digestive organs: Z80.0

## 2019-01-18 HISTORY — DX: Family history of malignant neoplasm of breast: Z80.3

## 2019-01-18 LAB — COMPREHENSIVE METABOLIC PANEL
ALT: 49 U/L — ABNORMAL HIGH (ref 0–35)
AST: 37 U/L (ref 0–37)
Albumin: 4.6 g/dL (ref 3.5–5.2)
Alkaline Phosphatase: 63 U/L (ref 39–117)
BUN: 16 mg/dL (ref 6–23)
CO2: 27 mEq/L (ref 19–32)
Calcium: 9.7 mg/dL (ref 8.4–10.5)
Chloride: 107 mEq/L (ref 96–112)
Creatinine, Ser: 0.79 mg/dL (ref 0.40–1.20)
GFR: 79.08 mL/min (ref >60.00)
Glucose, Bld: 84 mg/dL (ref 70–99)
Potassium: 5.1 mEq/L (ref 3.5–5.1)
Sodium: 142 mEq/L (ref 135–145)
Total Bilirubin: 0.3 mg/dL (ref 0.2–1.2)
Total Protein: 6.7 g/dL (ref 6.0–8.3)

## 2019-01-18 LAB — CBC WITH DIFFERENTIAL/PLATELET
Basophils Absolute: 0.1 10*3/uL (ref 0.0–0.1)
Basophils Relative: 1.1 % (ref 0.0–3.0)
Eosinophils Absolute: 0.3 10*3/uL (ref 0.0–0.7)
Eosinophils Relative: 3.4 % (ref 0.0–5.0)
HCT: 44.1 % (ref 36.0–46.0)
Hemoglobin: 14.8 g/dL (ref 12.0–15.0)
Lymphocytes Relative: 24.3 % (ref 12.0–46.0)
Lymphs Abs: 2.1 10*3/uL (ref 0.7–4.0)
MCHC: 33.5 g/dL (ref 30.0–36.0)
MCV: 99.9 fl (ref 78.0–100.0)
Monocytes Absolute: 0.6 10*3/uL (ref 0.1–1.0)
Monocytes Relative: 7 % (ref 3.0–12.0)
Neutro Abs: 5.5 10*3/uL (ref 1.4–7.7)
Neutrophils Relative %: 64.2 % (ref 43.0–77.0)
Platelets: 346 10*3/uL (ref 150.0–400.0)
RBC: 4.41 Mil/uL (ref 3.87–5.11)
RDW: 13.2 % (ref 11.5–15.5)
WBC: 8.6 10*3/uL (ref 4.0–10.5)

## 2019-01-18 LAB — LIPID PANEL
Cholesterol: 237 mg/dL — ABNORMAL HIGH (ref 0–200)
HDL: 89.4 mg/dL (ref >39.00)
LDL Cholesterol: 133 mg/dL — ABNORMAL HIGH (ref 0–99)
NonHDL: 147.58
Total CHOL/HDL Ratio: 3
Triglycerides: 74 mg/dL (ref 0.0–149.0)
VLDL: 14.8 mg/dL (ref 0.0–40.0)

## 2019-01-18 NOTE — Progress Notes (Signed)
Subjective  Chief Complaint  Patient presents with  . Establish Care    No PCP in 3 yrs  . Annual Exam    HPI: [name] is a 43 y.o. female who presents to [place] [facility] today for a Female Wellness Visit.   Wellness Visit: annual visit with health maintenance review and exam without Pap   Healthy 61 yo married mother of 2 children w/o medical concerns. Risk factors are strong fh of cancers; sister with ovarian cancer and negative genetic screen. Pt has had colon cancer surveillance with serial colonoscopies; gets annual mammograms as well. Sees GYN.   Smoker x 10 years. Never has tried to quit. Would like to but not sure how/when. No complications. Her husband quit 4 months ago on his own.   Assessment  1. Annual physical exam   2. Nicotine dependence, cigarettes, uncomplicated   3. Family history of colon cancer   4. Family history of ovarian cancer   5. Family history of breast cancer in mother   48. Need for immunization against influenza      Plan  Female Wellness Visit:  Age appropriate Health Maintenance and Prevention measures were discussed with patient. Included topics are cancer screening recommendations, ways to keep healthy (see AVS) including dietary and exercise recommendations, regular eye and dental care, use of seat belts, and avoidance of moderate alcohol use and tobacco use. Screens up to date  BMI: discussed patient's BMI and encouraged positive lifestyle modifications to help get to or maintain a target BMI.  HM needs and immunizations were addressed and ordered. See below for orders. See HM and immunization section for updates. Flu shot today  Routine labs and screening tests ordered including cmp, cbc and lipids where appropriate.  Discussed recommendations regarding Vit D and calcium supplementation (see AVS)  Smoking cessation counseling done. Precontemplative. To return if wanting more help. See avs.  Follow up: 12  months for cpe   Orders Placed This Encounter  Procedures  . Flu Vaccine QUAD 36+ mos IM  . CBC with Differential/Platelet  . Comprehensive metabolic panel  . Lipid panel  . HIV Antibody (routine testing w rflx)   No orders of the defined types were placed in this encounter.    Lifestyle: Body mass index is 24.48 kg/m. Wt Readings from Last 3 Encounters:  01/18/19 170 lb 9.6 oz (77.4 kg)  05/27/15 186 lb (84.4 kg)     Patient Active Problem List   Diagnosis Date Noted  . Nicotine dependence, cigarettes, uncomplicated [id number]  . Family history of colon cancer 01/18/2019  . Family history of ovarian cancer 01/18/2019  . Family history of breast cancer in mother 01/18/2019  . History of colonoscopy 11/05/2014    Overview:  02/23/14 lymphocytic colitis, tubular adenoma (very low risk malignancy)  Per Dr Shary Key Overview:  02/23/14  Normal, no H pylori    Health Maintenance  Topic Date Due  . HIV Screening  02/02/1990  . TETANUS/TDAP  02/02/1994  . INFLUENZA VACCINE  12/24/2018  . PAP SMEAR-Modifier  05/25/2021   Immunization History  Administered Date(s) Administered  . Influenza,inj,Quad PF,6+ Mos 01/18/2019   We updated and reviewed the patient's past history in detail and it is documented below. Allergies: Patient is allergic to amoxicillin-pot clavulanate and sulfur. Past Medical History Patient  has a past medical history of Colitis, Family history of breast cancer in mother (01/18/2019), and Family history of colon cancer (01/18/2019). Past Surgical History Patient  has a past surgical history that includes Cesarean section; Breast biopsy (Right, 12/2014); Breast biopsy (Left, 12/2014); and Tubal ligation. Family History: Patient family history includes Alcohol abuse in her maternal grandmother; Breast cancer (age of onset: 20) in her mother; Colon cancer in her sister; Healthy in her daughter; Heart disease in her maternal grandfather and maternal uncle;  Hyperlipidemia in her father; Hypertension in her father; Ovarian cancer in her maternal grandmother; Stroke in her maternal grandmother. Social History:  Patient  reports that she has been smoking cigarettes. She has a 10.00 pack-year smoking history. She has never used smokeless tobacco. She reports current alcohol use. She reports that she does not use drugs.  Review of Systems: Constitutional: negative for fever or malaise Ophthalmic: negative for photophobia, double vision or loss of vision Cardiovascular: negative for chest pain, dyspnea on exertion, or new LE swelling Respiratory: negative for SOB or persistent cough Gastrointestinal: negative for abdominal pain, change in bowel habits or melena Genitourinary: negative for dysuria or gross hematuria, no abnormal uterine bleeding or disharge Musculoskeletal: negative for new gait disturbance or muscular weakness Integumentary: negative for new or persistent rashes, no breast lumps Neurological: negative for TIA or stroke symptoms Psychiatric: negative for SI or delusions Allergic/Immunologic: negative for hives  Patient Care Team    Relationship Specialty Notifications Start End  Leamon Arnt, MD PCP - General Family Medicine  01/18/19   Cheri Fowler, MD Consulting Physician Obstetrics and Gynecology  01/18/19   Lucienne Capers, MD Referring Physician Gastroenterology  01/18/19     Objective  Vitals: BP 126/78   Pulse 98   Temp 98.4 F (36.9 C) (Tympanic)   Resp 16   Ht 5\' 10"  (1.778 m)   Wt 170 lb 9.6 oz (77.4 kg)   LMP 01/12/2019   SpO2 98%   BMI 24.48 kg/m  General:  Well developed, well nourished, no acute distress  Psych:  Alert and orientedx3,normal mood and affect HEENT:  Normocephalic, atraumatic, non-icteric sclera, PERRL, oropharynx is clear without mass or exudate, supple neck without adenopathy, mass or thyromegaly Cardiovascular:  Normal S1, S2, RRR without gallop, rub or murmur, nondisplaced PMI  Respiratory:  Good breath sounds bilaterally, CTAB with normal respiratory effort Gastrointestinal: normal bowel sounds, soft, non-tender, no noted masses. No HSM MSK: no deformities, contusions. Joints are without erythema or swelling. Spine and CVA region are nontender Skin:  Warm, no rashes or suspicious lesions noted Neurologic:    Mental status is normal. CN 2-11 are normal. Gross motor and sensory exams are normal. Normal gait. No tremor   Commons side effects, risks, benefits, and alternatives for medications and treatment plan prescribed today were discussed, and the patient expressed understanding of the given instructions. Patient is instructed to call or message via MyChart if he/she has any questions or concerns regarding our treatment plan. No barriers to understanding were identified. We discussed Red Flag symptoms and signs in detail. Patient expressed understanding regarding what to do in case of urgent or emergency type symptoms.   Medication list was reconciled, printed and provided to the patient in AVS. Patient instructions and summary information was reviewed with the patient as documented in the AVS. This note was prepared with assistance of Dragon voice recognition software. Occasional wrong-word or sound-a-like substitutions may have occurred due to the inherent limitations of voice recognition software

## 2019-01-18 NOTE — Patient Instructions (Addendum)
Please return in 12 months for your annual complete physical; please come fasting.  Please sign up for mychart.  I will release your lab results to you on your MyChart account with further instructions. Please reply with any questions.    It was a pleasure meeting you today! Thank you for choosing [place] to meet your healthcare needs! I truly look forward to working with you. If you have any questions or concerns, please send me a message via Mychart or call the office at [phone].   Preventive Care 32-64 Years Old, Female Preventive care refers to visits with your health care provider and lifestyle choices that can promote health and wellness. This includes:  A yearly physical exam. This may also be called an annual well check.  Regular dental visits and eye exams.  Immunizations.  Screening for certain conditions.  Healthy lifestyle choices, such as eating a healthy diet, getting regular exercise, not using drugs or products that contain nicotine and tobacco, and limiting alcohol use. What can I expect for my preventive care visit? Physical exam Your health care provider will check your:  Height and weight. This may be used to calculate body mass index (BMI), which tells if you are at a healthy weight.  Heart rate and blood pressure.  Skin for abnormal spots. Counseling Your health care provider may ask you questions about your:  Alcohol, tobacco, and drug use.  Emotional well-being.  Home and relationship well-being.  Sexual activity.  Eating habits.  Work and work Statistician.  Method of birth control.  Menstrual cycle.  Pregnancy history. What immunizations do I need?  Influenza (flu) vaccine  This is recommended every year. Tetanus, diphtheria, and pertussis (Tdap) vaccine  You may need a Td booster every 10 years. Varicella (chickenpox) vaccine  You may need this if you have not been vaccinated. Zoster (shingles) vaccine  You may need this after  age 108. Measles, mumps, and rubella (MMR) vaccine  You may need at least one dose of MMR if you were born in 1957 or later. You may also need a second dose. Pneumococcal conjugate (PCV13) vaccine  You may need this if you have certain conditions and were not previously vaccinated. Pneumococcal polysaccharide (PPSV23) vaccine  You may need one or two doses if you smoke cigarettes or if you have certain conditions. Meningococcal conjugate (MenACWY) vaccine  You may need this if you have certain conditions. Hepatitis A vaccine  You may need this if you have certain conditions or if you travel or work in places where you may be exposed to hepatitis A. Hepatitis B vaccine  You may need this if you have certain conditions or if you travel or work in places where you may be exposed to hepatitis B. Haemophilus influenzae type b (Hib) vaccine  You may need this if you have certain conditions. Human papillomavirus (HPV) vaccine  If recommended by your health care provider, you may need three doses over 6 months. You may receive vaccines as individual doses or as more than one vaccine together in one shot (combination vaccines). Talk with your health care provider about the risks and benefits of combination vaccines. What tests do I need? Blood tests  Lipid and cholesterol levels. These may be checked every 5 years, or more frequently if you are over 16 years old.  Hepatitis C test.  Hepatitis B test. Screening  Lung cancer screening. You may have this screening every year starting at age 80 if you have a 30-pack-year history  of smoking and currently smoke or have quit within the past 15 years.  Colorectal cancer screening. All adults should have this screening starting at age 66 and continuing until age 65. Your health care provider may recommend screening at age 5 if you are at increased risk. You will have tests every 1-10 years, depending on your results and the type of screening  test.  Diabetes screening. This is done by checking your blood sugar (glucose) after you have not eaten for a while (fasting). You may have this done every 1-3 years.  Mammogram. This may be done every 1-2 years. Talk with your health care provider about when you should start having regular mammograms. This may depend on whether you have a family history of breast cancer.  BRCA-related cancer screening. This may be done if you have a family history of breast, ovarian, tubal, or peritoneal cancers.  Pelvic exam and Pap test. This may be done every 3 years starting at age 29. Starting at age 46, this may be done every 5 years if you have a Pap test in combination with an HPV test. Other tests  Sexually transmitted disease (STD) testing.  Bone density scan. This is done to screen for osteoporosis. You may have this scan if you are at high risk for osteoporosis. Follow these instructions at home: Eating and drinking  Eat a diet that includes fresh fruits and vegetables, whole grains, lean protein, and low-fat dairy.  Take vitamin and mineral supplements as recommended by your health care provider.  Do not drink alcohol if: ? Your health care provider tells you not to drink. ? You are pregnant, may be pregnant, or are planning to become pregnant.  If you drink alcohol: ? Limit how much you have to 0-1 drink a day. ? Be aware of how much alcohol is in your drink. In the [place] one drink equals one 12 oz bottle of beer (355 mL), one 5 oz glass of wine (148 mL), or one 1 oz glass of hard liquor (44 mL). Lifestyle  Take daily care of your teeth and gums.  Stay active. Exercise for at least 30 minutes on 5 or more days each week.  Do not use any products that contain nicotine or tobacco, such as cigarettes, e-cigarettes, and chewing tobacco. If you need help quitting, ask your health care provider.  If you are sexually active, practice safe sex. Use a condom or other form of birth control  (contraception) in order to prevent pregnancy and STIs (sexually transmitted infections).  If told by your health care provider, take low-dose aspirin daily starting at age 31. What's next?  Visit your health care provider once a year for a well check visit.  Ask your health care provider how often you should have your eyes and teeth checked.  Stay up to date on all vaccines. This information is not intended to replace advice given to you by your health care provider. Make sure you discuss any questions you have with your health care provider. Document Released: 06/07/2015 Document Revised: 01/20/2018 Document Reviewed: 01/20/2018 Elsevier Patient Education  2020 [employer].   Steps to Quit Smoking Smoking tobacco is the leading cause of preventable death. It can affect almost every organ in the body. Smoking puts you and those around you at risk for developing many serious chronic diseases. Quitting smoking can be difficult, but it is one of the best things that you can do for your health. It is never too late to  quit. How do I get ready to quit? When you decide to quit smoking, create a plan to help you succeed. Before you quit:  Pick a date to quit. Set a date within the next 2 weeks to give you time to prepare.  Write down the reasons why you are quitting. Keep this list in places where you will see it often.  Tell your family, friends, and co-workers that you are quitting. Support from your loved ones can make quitting easier.  Talk with your health care provider about your options for quitting smoking.  Find out what treatment options are covered by your health insurance.  Identify people, places, things, and activities that make you want to smoke (triggers). Avoid them. What first steps can I take to quit smoking?  Throw away all cigarettes at home, at work, and in your car.  Throw away smoking accessories, such as Scientist, research (medical).  Clean your car. Make sure to  empty the ashtray.  Clean your home, including curtains and carpets. What strategies can I use to quit smoking? Talk with your health care provider about combining strategies, such as taking medicines while you are also receiving in-person counseling. Using these two strategies together makes you more likely to succeed in quitting than if you used either strategy on its own.  If you are pregnant or breastfeeding, talk with your health care provider about finding counseling or other support strategies to quit smoking. Do not take medicine to help you quit smoking unless your health care provider tells you to do so. To quit smoking: Quit right away  Quit smoking completely, instead of gradually reducing how much you smoke over a period of time. Research shows that stopping smoking right away is more successful than gradually quitting.  Attend in-person counseling to help you build problem-solving skills. You are more likely to succeed in quitting if you attend counseling sessions regularly. Even short sessions of 10 minutes can be effective. Take medicine You may take medicines to help you quit smoking. Some medicines require a prescription and some you can purchase over-the-counter. Medicines may have nicotine in them to replace the nicotine in cigarettes. Medicines may:  Help to stop cravings.  Help to relieve withdrawal symptoms. Your health care provider may recommend:  Nicotine patches, gum, or lozenges.  Nicotine inhalers or sprays.  Non-nicotine medicine that is taken by mouth. Find resources Find resources and support systems that can help you to quit smoking and remain smoke-free after you quit. These resources are most helpful when you use them often. They include:  Online chats with a Social worker.  Telephone quitlines.  Printed Furniture conservator/restorer.  Support groups or group counseling.  Text messaging programs.  Mobile phone apps or applications. Use apps that can help  you stick to your quit plan by providing reminders, tips, and encouragement. There are many free apps for mobile devices as well as websites. Examples include Quit Guide from the [employer] and smokefree.gov What things can I do to make it easier to quit?   Reach out to your family and friends for support and encouragement. Call telephone quitlines (1-800-QUIT-NOW), reach out to support groups, or work with a counselor for support.  Ask people who smoke to avoid smoking around you.  Avoid places that trigger you to smoke, such as bars, parties, or smoke-break areas at work.  Spend time with people who do not smoke.  Lessen the stress in your life. Stress can be a smoking trigger for some  people. To lessen stress, try: ? Exercising regularly. ? Doing deep-breathing exercises. ? Doing yoga. ? Meditating. ? Performing a body scan. This involves closing your eyes, scanning your body from head to toe, and noticing which parts of your body are particularly tense. Try to relax the muscles in those areas. How will I feel when I quit smoking? Day 1 to 3 weeks Within the first 24 hours of quitting smoking, you may start to feel withdrawal symptoms. These symptoms are usually most noticeable 2-3 days after quitting, but they usually do not last for more than 2-3 weeks. You may experience these symptoms:  Mood swings.  Restlessness, anxiety, or irritability.  Trouble concentrating.  Dizziness.  Strong cravings for sugary foods and nicotine.  Mild weight gain.  Constipation.  Nausea.  Coughing or a sore throat.  Changes in how the medicines that you take for unrelated issues work in your body.  Depression.  Trouble sleeping (insomnia). Week 3 and afterward After the first 2-3 weeks of quitting, you may start to notice more positive results, such as:  Improved sense of smell and taste.  Decreased coughing and sore throat.  Slower heart rate.  Lower blood pressure.  Clearer skin.   The ability to breathe more easily.  Fewer sick days. Quitting smoking can be very challenging. Do not get discouraged if you are not successful the first time. Some people need to make many attempts to quit before they achieve long-term success. Do your best to stick to your quit plan, and talk with your health care provider if you have any questions or concerns. Summary  Smoking tobacco is the leading cause of preventable death. Quitting smoking is one of the best things that you can do for your health.  When you decide to quit smoking, create a plan to help you succeed.  Quit smoking right away, not slowly over a period of time.  When you start quitting, seek help from your health care provider, family, or friends. This information is not intended to replace advice given to you by your health care provider. Make sure you discuss any questions you have with your health care provider. Document Released: 05/05/2001 Document Revised: 07/29/2018 Document Reviewed: 07/30/2018 Elsevier Patient Education  2020 [employer].

## 2019-01-19 LAB — HIV ANTIBODY (ROUTINE TESTING W REFLEX): HIV 1&2 Ab, 4th Generation: NONREACTIVE

## 2019-02-20 DIAGNOSIS — Z130 Encounter for screening for diseases of the blood and blood-forming organs and certain disorders involving the immune mechanism: Secondary | ICD-10-CM | POA: Diagnosis not present

## 2019-02-20 DIAGNOSIS — Z1389 Encounter for screening for other disorder: Secondary | ICD-10-CM | POA: Diagnosis not present

## 2019-02-20 DIAGNOSIS — Z6824 Body mass index (BMI) 24.0-24.9, adult: Secondary | ICD-10-CM | POA: Diagnosis not present

## 2019-02-20 DIAGNOSIS — Z01419 Encounter for gynecological examination (general) (routine) without abnormal findings: Secondary | ICD-10-CM | POA: Diagnosis not present

## 2019-05-24 ENCOUNTER — Other Ambulatory Visit: Payer: Self-pay | Admitting: Obstetrics and Gynecology

## 2019-05-24 DIAGNOSIS — Z1231 Encounter for screening mammogram for malignant neoplasm of breast: Secondary | ICD-10-CM

## 2019-07-05 ENCOUNTER — Ambulatory Visit: Payer: BC Managed Care – PPO

## 2019-08-08 ENCOUNTER — Ambulatory Visit
Admission: RE | Admit: 2019-08-08 | Discharge: 2019-08-08 | Disposition: A | Discharge status: Home / Self Care | Payer: BC Managed Care – PPO | Source: Ambulatory Visit | Attending: Obstetrics and Gynecology | Admitting: Obstetrics and Gynecology

## 2019-08-08 ENCOUNTER — Other Ambulatory Visit: Payer: Self-pay

## 2019-08-08 DIAGNOSIS — Z1231 Encounter for screening mammogram for malignant neoplasm of breast: Secondary | ICD-10-CM

## 2019-08-09 ENCOUNTER — Other Ambulatory Visit: Payer: Self-pay | Admitting: Obstetrics and Gynecology

## 2019-08-09 ENCOUNTER — Other Ambulatory Visit: Payer: Self-pay | Admitting: Family Medicine

## 2019-08-09 DIAGNOSIS — R928 Other abnormal and inconclusive findings on diagnostic imaging of breast: Secondary | ICD-10-CM

## 2019-08-11 ENCOUNTER — Ambulatory Visit
Admission: RE | Admit: 2019-08-11 | Discharge: 2019-08-11 | Disposition: A | Discharge status: Home / Self Care | Payer: BC Managed Care – PPO | Source: Ambulatory Visit | Attending: Obstetrics and Gynecology | Admitting: Obstetrics and Gynecology

## 2019-08-11 ENCOUNTER — Other Ambulatory Visit: Payer: Self-pay

## 2019-08-11 DIAGNOSIS — R922 Inconclusive mammogram: Secondary | ICD-10-CM | POA: Diagnosis not present

## 2019-08-11 DIAGNOSIS — N6321 Unspecified lump in the left breast, upper outer quadrant: Secondary | ICD-10-CM | POA: Diagnosis not present

## 2019-08-11 DIAGNOSIS — R928 Other abnormal and inconclusive findings on diagnostic imaging of breast: Secondary | ICD-10-CM

## 2020-02-22 DIAGNOSIS — Z1389 Encounter for screening for other disorder: Secondary | ICD-10-CM | POA: Diagnosis not present

## 2020-02-22 DIAGNOSIS — Z6825 Body mass index (BMI) 25.0-25.9, adult: Secondary | ICD-10-CM | POA: Diagnosis not present

## 2020-02-22 DIAGNOSIS — Z01419 Encounter for gynecological examination (general) (routine) without abnormal findings: Secondary | ICD-10-CM | POA: Diagnosis not present

## 2020-02-22 DIAGNOSIS — Z130 Encounter for screening for diseases of the blood and blood-forming organs and certain disorders involving the immune mechanism: Secondary | ICD-10-CM | POA: Diagnosis not present

## 2020-02-26 ENCOUNTER — Other Ambulatory Visit: Payer: Self-pay | Admitting: Obstetrics and Gynecology

## 2020-02-26 DIAGNOSIS — R1012 Left upper quadrant pain: Secondary | ICD-10-CM

## 2020-02-28 ENCOUNTER — Other Ambulatory Visit: Payer: BC Managed Care – PPO

## 2020-03-01 ENCOUNTER — Ambulatory Visit
Admission: RE | Admit: 2020-03-01 | Discharge: 2020-03-01 | Disposition: A | Discharge status: Home / Self Care | Payer: BC Managed Care – PPO | Source: Ambulatory Visit | Attending: Obstetrics and Gynecology | Admitting: Obstetrics and Gynecology

## 2020-03-01 DIAGNOSIS — K7689 Other specified diseases of liver: Secondary | ICD-10-CM | POA: Diagnosis not present

## 2020-03-01 DIAGNOSIS — R1012 Left upper quadrant pain: Secondary | ICD-10-CM

## 2020-03-04 ENCOUNTER — Telehealth: Payer: Self-pay

## 2020-03-04 NOTE — Telephone Encounter (Signed)
[name] is calling in saying she seen her OBGYN doctor recently for abdominal pain, to which they discovered on an ultrasound that she had a fatty liver and her OB recommended that she follow up with Dr.Andy, but patient is concerned and is wanting to know if Dr.Andy could see her this week, instead of next week.

## 2020-03-04 NOTE — Telephone Encounter (Signed)
LVM for patient to call back. ?

## 2020-03-04 NOTE — Telephone Encounter (Signed)
You can schedule patient for Thursday

## 2020-03-07 ENCOUNTER — Encounter: Payer: Self-pay | Admitting: Family Medicine

## 2020-03-07 ENCOUNTER — Other Ambulatory Visit: Payer: Self-pay

## 2020-03-07 ENCOUNTER — Ambulatory Visit (INDEPENDENT_AMBULATORY_CARE_PROVIDER_SITE_OTHER): Payer: BC Managed Care – PPO | Admitting: Family Medicine

## 2020-03-07 VITALS — BP 160/100 | HR 97 | Temp 98.0°F | Wt 176.6 lb

## 2020-03-07 DIAGNOSIS — R7989 Other specified abnormal findings of blood chemistry: Secondary | ICD-10-CM | POA: Diagnosis not present

## 2020-03-07 DIAGNOSIS — Z23 Encounter for immunization: Secondary | ICD-10-CM

## 2020-03-07 DIAGNOSIS — K760 Fatty (change of) liver, not elsewhere classified: Secondary | ICD-10-CM

## 2020-03-07 DIAGNOSIS — R1012 Left upper quadrant pain: Secondary | ICD-10-CM | POA: Diagnosis not present

## 2020-03-07 DIAGNOSIS — G8929 Other chronic pain: Secondary | ICD-10-CM | POA: Diagnosis not present

## 2020-03-07 DIAGNOSIS — R030 Elevated blood-pressure reading, without diagnosis of hypertension: Secondary | ICD-10-CM

## 2020-03-07 NOTE — Progress Notes (Signed)
Subjective  CC:  Chief Complaint  Patient presents with  . hepatic steatosis    [place] ordered by OB/GYN dx left sided abdominal pain     HPI: [name] is a 44 y.o. female who presents to the office today to address the problems listed above in the chief complaint.  44 year old female who recently saw her gynecologist for female wellness exam.  Mentions left-sided upper abdominal pain that is intermittent.  Abdominal ultrasound shows hepatic steatosis.  I reviewed the results and findings.  No lab work was done.  Patient reports 12-18 month history of intermittent left upper abdominal pain.  She describes it as a feeling of fullness or a twinge.  The episodes usually last only momentarily.  She is never needed to take pain medicine.  However with certain position changes or lying down at night the pains can be more frequent.  She denies fevers, chills, nausea, vomiting, dyspepsia.  However she reports a long history of chronic intermittent abdominal bloating and changes in bowel movements.  She has seen digestive health specialists in the past.  She has had several colonoscopies for high risk colon cancer screening.  She says she has not been diagnosed with IBS but was treated with Bentyl and/or Levsin.  She tries eat a clean diet.  She uses rare Tylenol but admits to drinking 2-3 alcoholic beverages per night on average.  She tries eat a low-fat diet.   Assessment  1. Abdominal pain, chronic, left upper quadrant   2. Hepatic steatosis   3. Elevated liver function tests   4. Need for immunization against influenza   5. Elevated blood pressure reading without diagnosis of hypertension      Plan   Abdominal pain chronic left upper quadrant: Check lab work.  Intermittent can normal exam.  Suspect could be related to possible IBS.  Need old GI records.  Reassured.  However due to chronicity, check abdominal CT scan.  Hepatic steatosis by ultrasound with history of elevated liver  function test.  Recheck LFTs, check for secondary causes of hepatocellular disease.  Educated and reassured.  Discussed low-fat diet, abstaining from alcohol and monitoring.  Abdominal CT will also help further clarify diagnosis.  Elevated blood pressure most likely related to anxiety and stress related to her current diagnosis.  No history of hypertension.  Will monitor  Follow up: CPE 06/17/2020  Orders Placed This Encounter  Procedures  . CT ABDOMEN W CONTRAST  . Flu Vaccine QUAD 36+ mos IM  . COMPLETE METABOLIC PANEL WITH GFR  . CBC with Differential/Platelet  . Lipid panel  . TSH  . Hepatitis B surface antigen  . Hepatitis C antibody  . Hepatitis B surface antibody,qualitative  . Ceruloplasmin  . Alpha-1-antitrypsin  . Hepatitis A Ab, Total  . Anti-smooth muscle antibody, IgG  . Mitochondrial Antibodies  . Iron, TIBC and Ferritin Panel   No orders of the defined types were placed in this encounter.     I reviewed the patients updated PMH, FH, and SocHx.    Patient Active Problem List   Diagnosis Date Noted  . Nicotine dependence, cigarettes, uncomplicated 19/62/2297  . Family history of colon cancer 01/18/2019  . Family history of ovarian cancer 01/18/2019  . Family history of breast cancer in mother 01/18/2019  . History of colonoscopy 11/05/2014   Current Meds  Medication Sig  . cholecalciferol (VITAMIN D3) 25 MCG (1000 UT) tablet Take 1,000 Units by mouth daily.  . Cyanocobalamin (VITAMIN B  12 PO) Take by mouth.  . polyethylene glycol (MIRALAX / GLYCOLAX) 17 g packet Take 17 g by mouth daily.  . ranitidine (ZANTAC) 150 MG tablet Take 150 mg by mouth 2 (two) times daily.    Allergies: Patient is allergic to amoxicillin-pot clavulanate and sulfur. Family History: Patient family history includes Alcohol abuse in her maternal grandmother; Breast cancer (age of onset: 62) in her mother; Colon cancer in her sister; Healthy in her daughter; Heart disease in her  maternal grandfather and maternal uncle; Hyperlipidemia in her father; Hypertension in her father; Ovarian cancer in her maternal grandmother; Stroke in her maternal grandmother. Social History:  Patient  reports that she has been smoking cigarettes. She has a 10.00 pack-year smoking history. She has never used smokeless tobacco. She reports current alcohol use. She reports that she does not use drugs.  Review of Systems: Constitutional: Negative for fever malaise or anorexia Cardiovascular: negative for chest pain Respiratory: negative for SOB or persistent cough Gastrointestinal: negative for abdominal pain  Objective  Vitals: BP (!) 160/100   Pulse 97   Temp 98 F (36.7 C) (Temporal)   Wt 176 lb 9.6 oz (80.1 kg)   SpO2 98%   BMI 25.34 kg/m  General: no acute distress , A&Ox3 HEENT: PEERL, conjunctiva normal, neck is supple Cardiovascular:  RRR without murmur or gallop.  Respiratory:  Good breath sounds bilaterally, CTAB with normal respiratory effort Gastrointestinal: soft, flat abdomen, normal active bowel sounds, no palpable masses, no hepatosplenomegaly, no appreciated hernias Skin:  Warm, no rashes     Commons side effects, risks, benefits, and alternatives for medications and treatment plan prescribed today were discussed, and the patient expressed understanding of the given instructions. Patient is instructed to call or message via MyChart if he/she has any questions or concerns regarding our treatment plan. No barriers to understanding were identified. We discussed Red Flag symptoms and signs in detail. Patient expressed understanding regarding what to do in case of urgent or emergency type symptoms.   Medication list was reconciled, printed and provided to the patient in AVS. Patient instructions and summary information was reviewed with the patient as documented in the AVS. This note was prepared with assistance of Dragon voice recognition software. Occasional wrong-word  or sound-a-like substitutions may have occurred due to the inherent limitations of voice recognition software  This visit occurred during the SARS-CoV-2 public health emergency.  Safety protocols were in place, including screening questions prior to the visit, additional usage of staff PPE, and extensive cleaning of exam room while observing appropriate contact time as indicated for disinfecting solutions.

## 2020-03-07 NOTE — Patient Instructions (Signed)
Please follow up as scheduled for your next visit with me: 06/17/2020   I will release your lab results to you on your MyChart account with further instructions. Please reply with any questions.   We will call you to get you scheduled for an abdominal ultrasound.   Please refrain from using tylenol and alcohol and eat a low fat diet to see if we can improve your liver tests.   If you have any questions or concerns, please don't hesitate to send me a message via MyChart or call the office at [phone]. Thank you for visiting with [place] today! It's our pleasure caring for you.   Fatty Liver Disease  Fatty liver disease occurs when too much fat has built up in your liver cells. Fatty liver disease is also called hepatic steatosis or steatohepatitis. The liver removes harmful substances from your bloodstream and produces fluids that your body needs. It also helps your body use and store energy from the food you eat. In many cases, fatty liver disease does not cause symptoms or problems. It is often diagnosed when tests are being done for other reasons. However, over time, fatty liver can cause inflammation that may lead to more serious liver problems, such as scarring of the liver (cirrhosis) and liver failure. Fatty liver is associated with insulin resistance, increased body fat, high blood pressure (hypertension), and high cholesterol. These are features of metabolic syndrome and increase your risk for stroke, diabetes, and heart disease. What are the causes? This condition may be caused by:  Drinking too much alcohol.  Poor nutrition.  Obesity.  Cushing's syndrome.  Diabetes.  High cholesterol.  Certain drugs.  Poisons.  Some viral infections.  Pregnancy. What increases the risk? You are more likely to develop this condition if you:  Abuse alcohol.  Are overweight.  Have diabetes.  Have hepatitis.  Have a high triglyceride level.  Are pregnant. What are the signs or  symptoms? Fatty liver disease often does not cause symptoms. If symptoms do develop, they can include:  Fatigue.  Weakness.  Weight loss.  Confusion.  Abdominal pain.  Nausea and vomiting.  Yellowing of your skin and the white parts of your eyes (jaundice).  Itchy skin. How is this diagnosed? This condition may be diagnosed by:  A physical exam and medical history.  Blood tests.  Imaging tests, such as an ultrasound, CT scan, or MRI.  A liver biopsy. A small sample of liver tissue is removed using a needle. The sample is then looked at under a microscope. How is this treated? Fatty liver disease is often caused by other health conditions. Treatment for fatty liver may involve medicines and lifestyle changes to manage conditions such as:  Alcoholism.  High cholesterol.  Diabetes.  Being overweight or obese. Follow these instructions at home:   Do not drink alcohol. If you have trouble quitting, ask your health care provider how to safely quit with the help of medicine or a supervised program. This is important to keep your condition from getting worse.  Eat a healthy diet as told by your health care provider. Ask your health care provider about working with a diet and nutrition specialist (dietitian) to develop an eating plan.  Exercise regularly. This can help you lose weight and control your cholesterol and diabetes. Talk to your health care provider about an exercise plan and which activities are best for you.  Take over-the-counter and prescription medicines only as told by your health care provider.  Keep  all follow-up visits as told by your health care provider. This is important. Contact a health care provider if: You have trouble controlling your:  Blood sugar. This is especially important if you have diabetes.  Cholesterol.  Drinking of alcohol. Get help right away if:  You have abdominal pain.  You have jaundice.  You have nausea and  vomiting.  You vomit blood or material that looks like coffee grounds.  You have stools that are black, tar-like, or bloody. Summary  Fatty liver disease develops when too much fat builds up in the cells of your liver.  Fatty liver disease often causes no symptoms or problems. However, over time, fatty liver can cause inflammation that may lead to more serious liver problems, such as scarring of the liver (cirrhosis).  You are more likely to develop this condition if you abuse alcohol, are pregnant, are overweight, have diabetes, have hepatitis, or have high triglyceride levels.  Contact your health care provider if you have trouble controlling your weight, blood sugar, cholesterol, or drinking of alcohol. This information is not intended to replace advice given to you by your health care provider. Make sure you discuss any questions you have with your health care provider. Document Revised: 04/23/2017 Document Reviewed: 02/17/2017 Elsevier Patient Education  2020 [employer].

## 2020-03-08 ENCOUNTER — Encounter: Payer: Self-pay | Admitting: Family Medicine

## 2020-03-10 LAB — CBC WITH DIFFERENTIAL/PLATELET
Absolute Monocytes: 705 cells/uL (ref 200–950)
Basophils Absolute: 57 cells/uL (ref 0–200)
Basophils Relative: 0.7 %
Eosinophils Absolute: 170 cells/uL (ref 15–500)
Eosinophils Relative: 2.1 %
HCT: 45.3 % — ABNORMAL HIGH (ref 35.0–45.0)
Hemoglobin: 15.3 g/dL (ref 11.7–15.5)
Lymphs Abs: 1750 cells/uL (ref 850–3900)
MCH: 33.1 pg — ABNORMAL HIGH (ref 27.0–33.0)
MCHC: 33.8 g/dL (ref 32.0–36.0)
MCV: 98.1 fL (ref 80.0–100.0)
MPV: 10.2 fL (ref 7.5–12.5)
Monocytes Relative: 8.7 %
Neutro Abs: 5419 cells/uL (ref 1500–7800)
Neutrophils Relative %: 66.9 %
Platelets: 330 10*3/uL (ref 140–400)
RBC: 4.62 10*6/uL (ref 3.80–5.10)
RDW: 11.8 % (ref 11.0–15.0)
Total Lymphocyte: 21.6 %
WBC: 8.1 10*3/uL (ref 3.8–10.8)

## 2020-03-10 LAB — IRON,TIBC AND FERRITIN PANEL
%SAT: 31 % (calc) (ref 16–45)
Ferritin: 136 ng/mL (ref 16–232)
Iron: 122 ug/dL (ref 40–190)
TIBC: 396 mcg/dL (calc) (ref 250–450)

## 2020-03-10 LAB — COMPLETE METABOLIC PANEL WITH GFR
AG Ratio: 2 (calc) (ref 1.0–2.5)
ALT: 34 U/L — ABNORMAL HIGH (ref 6–29)
AST: 23 U/L (ref 10–35)
Albumin: 4.5 g/dL (ref 3.6–5.1)
Alkaline phosphatase (APISO): 57 U/L (ref 31–125)
BUN: 13 mg/dL (ref 7–25)
CO2: 26 mmol/L (ref 20–32)
Calcium: 10 mg/dL (ref 8.6–10.2)
Chloride: 102 mmol/L (ref 98–110)
Creat: 0.76 mg/dL (ref 0.50–1.10)
GFR, Est African American: 110 mL/min/{1.73_m2} (ref >=60)
GFR, Est Non African American: 95 mL/min/{1.73_m2} (ref >=60)
Globulin: 2.2 g/dL (calc) (ref 1.9–3.7)
Glucose, Bld: 90 mg/dL (ref 65–99)
Potassium: 4.1 mmol/L (ref 3.5–5.3)
Sodium: 138 mmol/L (ref 135–146)
Total Bilirubin: 0.5 mg/dL (ref 0.2–1.2)
Total Protein: 6.7 g/dL (ref 6.1–8.1)

## 2020-03-10 LAB — LIPID PANEL
Cholesterol: 247 mg/dL — ABNORMAL HIGH (ref <200)
HDL: 91 mg/dL (ref >=50)
LDL Cholesterol (Calc): 139 mg/dL (calc) — ABNORMAL HIGH
Non-HDL Cholesterol (Calc): 156 mg/dL (calc) — ABNORMAL HIGH (ref <130)
Total CHOL/HDL Ratio: 2.7 (calc) (ref <5.0)
Triglycerides: 74 mg/dL (ref <150)

## 2020-03-10 LAB — CERULOPLASMIN: Ceruloplasmin: 31 mg/dL (ref 18–53)

## 2020-03-10 LAB — HEPATITIS B SURFACE ANTIGEN: Hepatitis B Surface Ag: NONREACTIVE

## 2020-03-10 LAB — TSH: TSH: 1.57 mIU/L

## 2020-03-10 LAB — HEPATITIS B SURFACE ANTIBODY,QUALITATIVE: Hep B S Ab: REACTIVE — AB

## 2020-03-10 LAB — HEPATITIS A ANTIBODY, TOTAL: Hepatitis A AB,Total: NONREACTIVE

## 2020-03-10 LAB — ANTI-SMOOTH MUSCLE ANTIBODY, IGG: Actin (Smooth Muscle) Antibody (IGG): <20 U (ref <20)

## 2020-03-10 LAB — HEPATITIS C ANTIBODY
Hepatitis C Ab: NONREACTIVE
SIGNAL TO CUT-OFF: 0.02 (ref <1.00)

## 2020-03-10 LAB — MITOCHONDRIAL ANTIBODIES: Mitochondrial M2 Ab, IgG: <=20 U

## 2020-03-10 LAB — ALPHA-1-ANTITRYPSIN: A-1 Antitrypsin, Ser: 132 mg/dL (ref 83–199)

## 2020-03-12 ENCOUNTER — Telehealth: Payer: Self-pay | Admitting: Family Medicine

## 2020-03-12 NOTE — Telephone Encounter (Signed)
Please call patient: insurance is not approving the CT scan.  Instead, I recommend follow up with GI to further evaluate her chronic GI sxs and intermittent LUQ pain; they can get CT scan if they think it is warranted.  She has seen [facility] specialist in the past; can be referred to [facility] if she'd like as well.  Thanks.

## 2020-03-13 NOTE — Telephone Encounter (Signed)
Referral not needed.

## 2020-03-13 NOTE — Telephone Encounter (Signed)
Patient states she is OK following up with GI she has seen previously in the past for symptoms she is still experiencing

## 2020-03-13 NOTE — Telephone Encounter (Signed)
So she will call and get set up or we need to refer?  Please refer to [facility] if needed.

## 2020-03-13 NOTE — Telephone Encounter (Signed)
LMOVM to return my phone call

## 2020-06-17 ENCOUNTER — Encounter: Payer: BC Managed Care – PPO | Admitting: Family Medicine

## 2020-07-29 ENCOUNTER — Other Ambulatory Visit: Payer: Self-pay | Admitting: Obstetrics and Gynecology

## 2020-07-29 DIAGNOSIS — Z1231 Encounter for screening mammogram for malignant neoplasm of breast: Secondary | ICD-10-CM

## 2020-09-20 ENCOUNTER — Ambulatory Visit: Payer: Self-pay

## 2020-11-15 ENCOUNTER — Ambulatory Visit
Admission: RE | Admit: 2020-11-15 | Discharge: 2020-11-15 | Disposition: A | Discharge status: Home / Self Care | Payer: PRIVATE HEALTH INSURANCE | Source: Ambulatory Visit | Attending: Obstetrics and Gynecology | Admitting: Obstetrics and Gynecology

## 2020-11-15 ENCOUNTER — Other Ambulatory Visit: Payer: Self-pay

## 2020-11-15 DIAGNOSIS — Z1231 Encounter for screening mammogram for malignant neoplasm of breast: Secondary | ICD-10-CM

## 2020-12-26 ENCOUNTER — Other Ambulatory Visit: Payer: Self-pay | Admitting: Obstetrics and Gynecology

## 2020-12-26 DIAGNOSIS — N63 Unspecified lump in unspecified breast: Secondary | ICD-10-CM

## 2021-01-31 ENCOUNTER — Ambulatory Visit
Admission: RE | Admit: 2021-01-31 | Discharge: 2021-01-31 | Disposition: A | Discharge status: Home / Self Care | Payer: PRIVATE HEALTH INSURANCE | Source: Ambulatory Visit | Attending: Obstetrics and Gynecology | Admitting: Obstetrics and Gynecology

## 2021-01-31 ENCOUNTER — Other Ambulatory Visit: Payer: Self-pay | Admitting: Obstetrics and Gynecology

## 2021-01-31 ENCOUNTER — Other Ambulatory Visit: Payer: Self-pay

## 2021-01-31 DIAGNOSIS — N63 Unspecified lump in unspecified breast: Secondary | ICD-10-CM

## 2021-08-08 ENCOUNTER — Other Ambulatory Visit: Payer: Self-pay | Admitting: Obstetrics and Gynecology

## 2021-08-08 ENCOUNTER — Ambulatory Visit
Admission: RE | Admit: 2021-08-08 | Discharge: 2021-08-08 | Disposition: A | Discharge status: Home / Self Care | Payer: PRIVATE HEALTH INSURANCE | Source: Ambulatory Visit | Attending: Obstetrics and Gynecology | Admitting: Obstetrics and Gynecology

## 2021-08-08 DIAGNOSIS — N63 Unspecified lump in unspecified breast: Secondary | ICD-10-CM

## 2021-09-26 IMAGING — MG MM DIGITAL DIAGNOSTIC UNILAT*R* W/ TOMO W/ CAD
6 series · 6 of 18 positions shown · non-contrast
Comparison: Previous exam(s).

CLINICAL DATA: 44-year-old female presenting with a new lump in the
right breast for approximately two months. Family history of breast
cancer in the patient's mother.

EXAM:
DIGITAL DIAGNOSTIC UNILATERAL RIGHT MAMMOGRAM WITH TOMOSYNTHESIS AND
CAD; ULTRASOUND RIGHT BREAST LIMITED
TECHNIQUE: Right digital diagnostic mammography and breast tomosynthesis was
performed. The images were evaluated with computer-aided detection.;
Targeted ultrasound examination of the right breast was performed

[R CC synth-2D (1 of 2)]
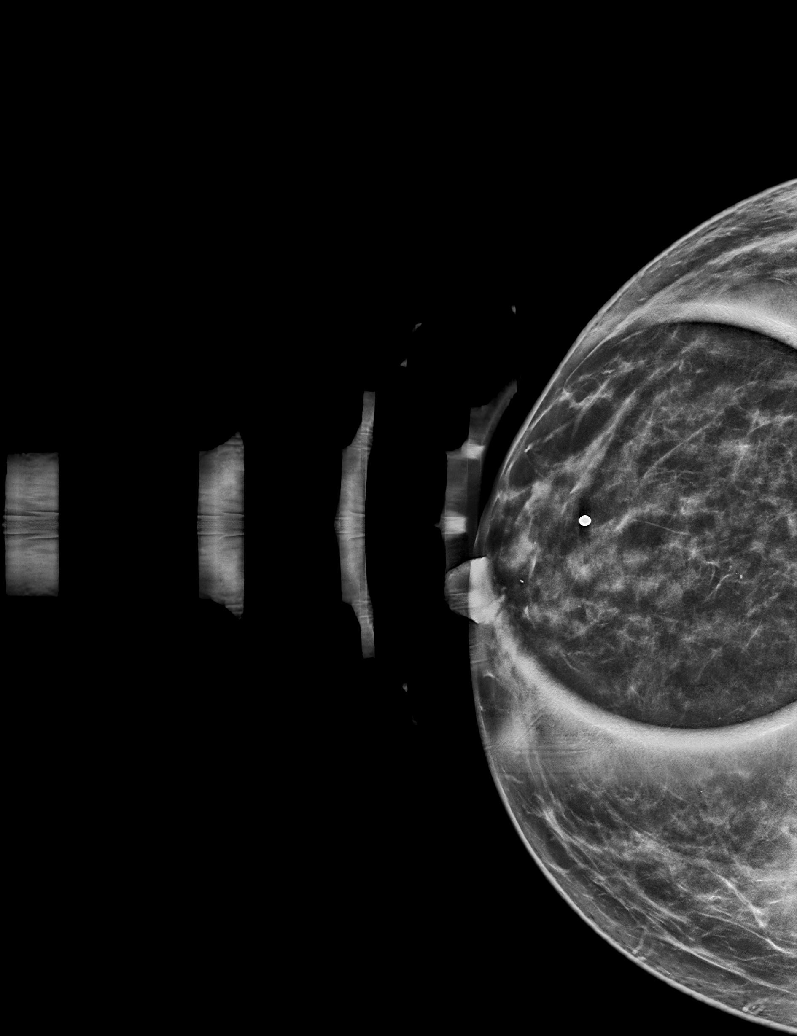

[R CC synth-2D (2 of 2)]
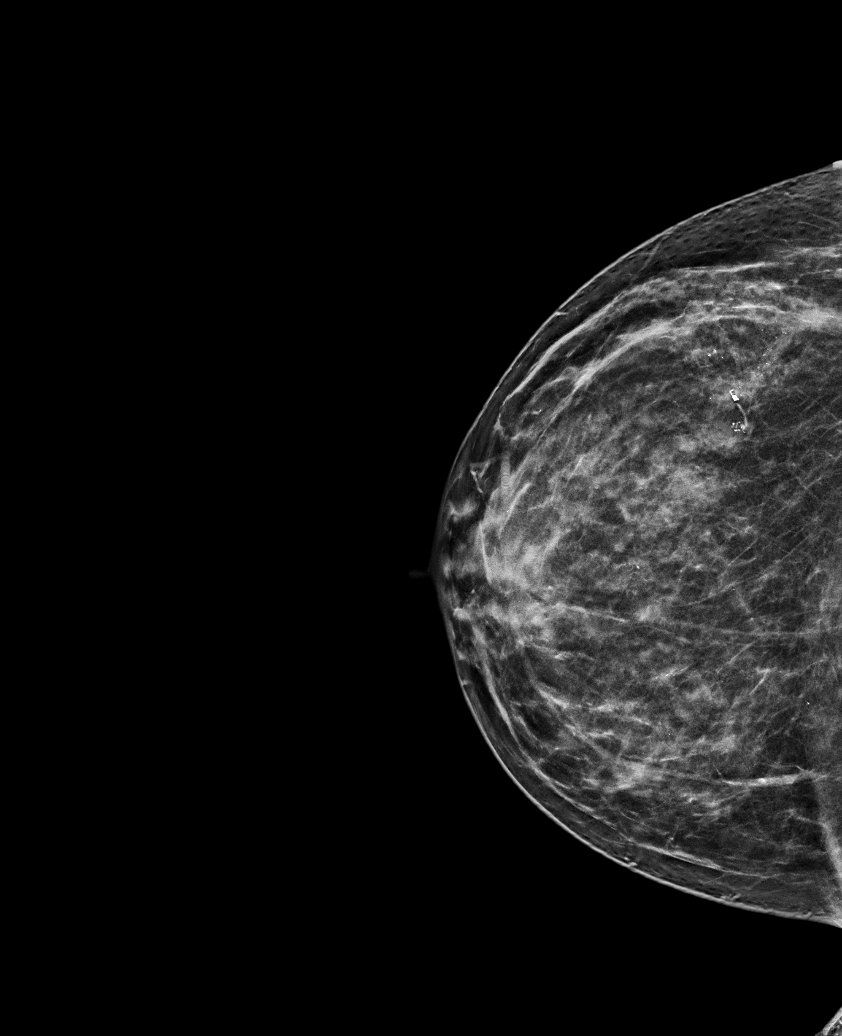

[R MLO synth-2D]
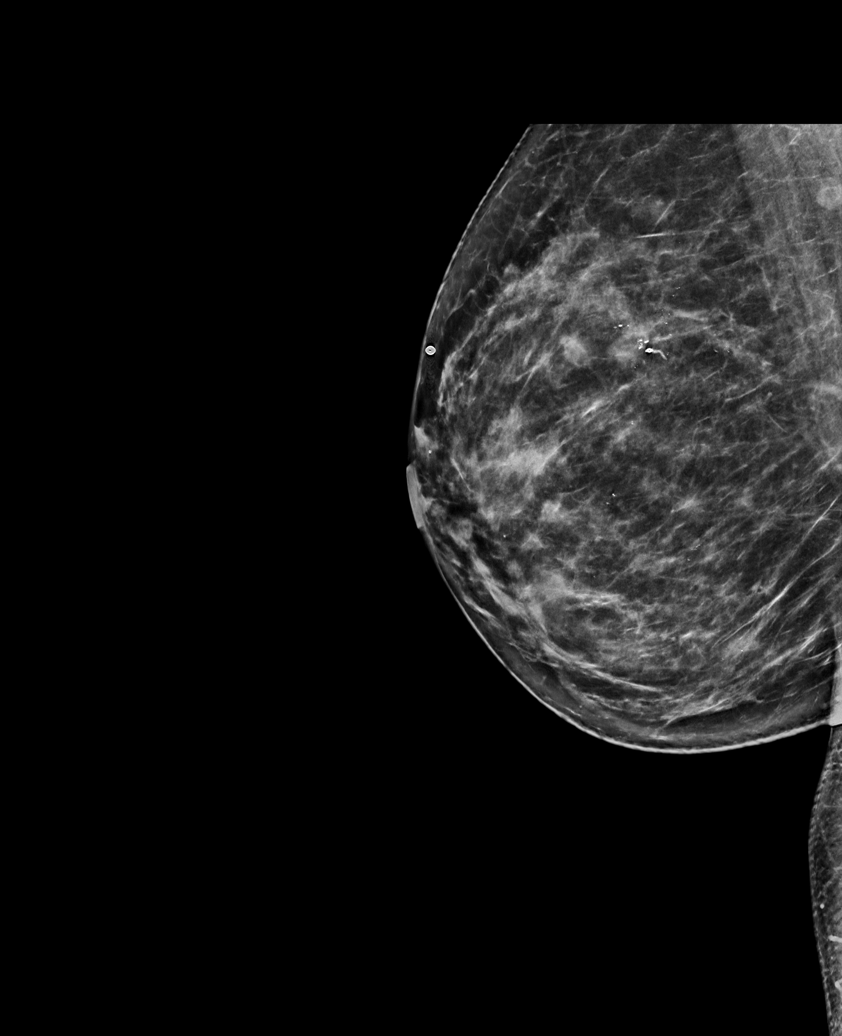

[R MLO tomo · tomo slice 39/78.0]
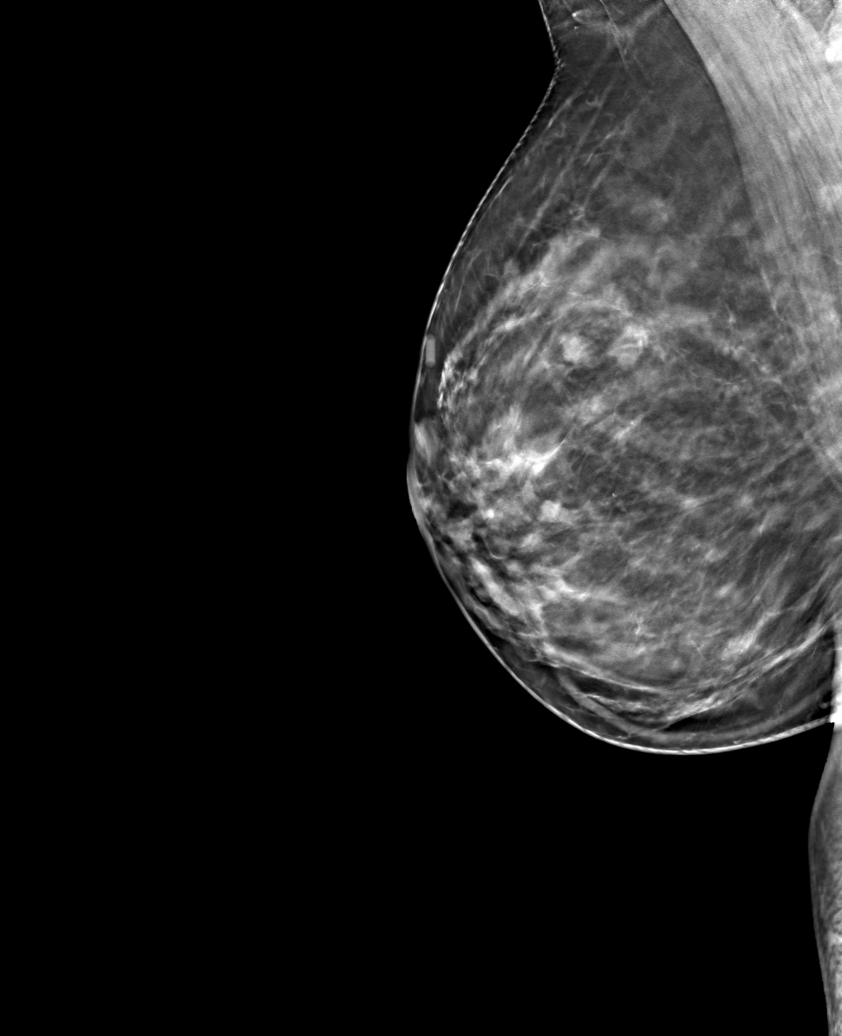

[R CC tomo (1 of 2) · tomo slice 31/62.0]
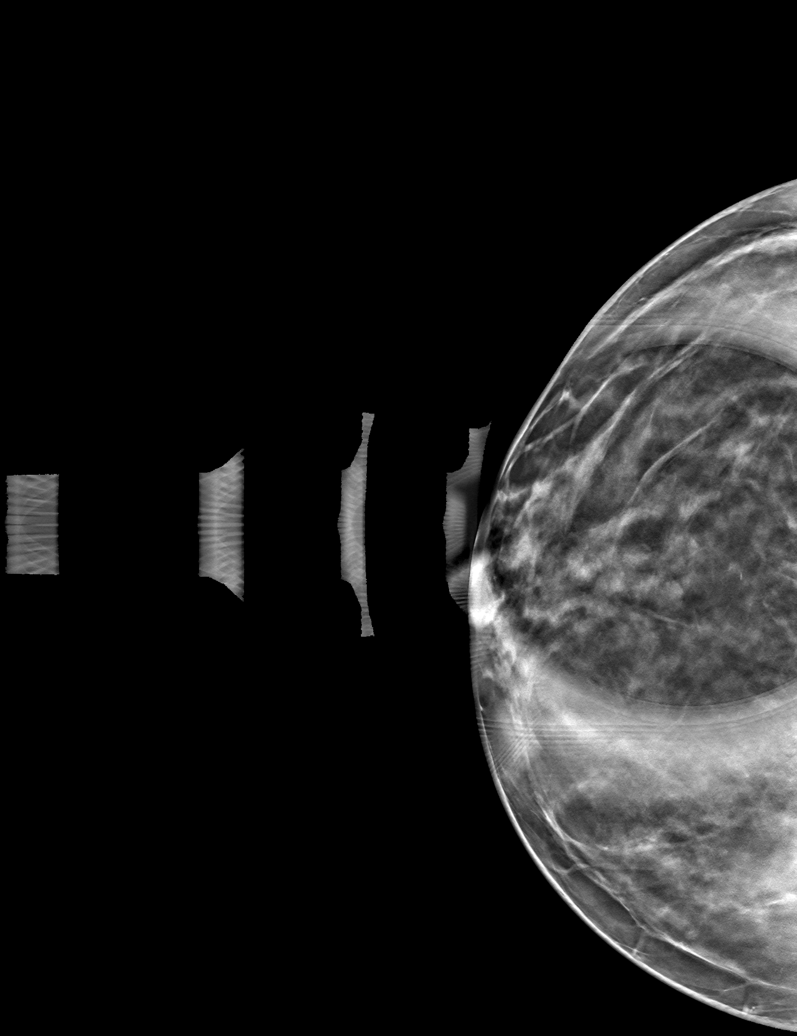

[R CC tomo (2 of 2) · tomo slice 21/41.0]
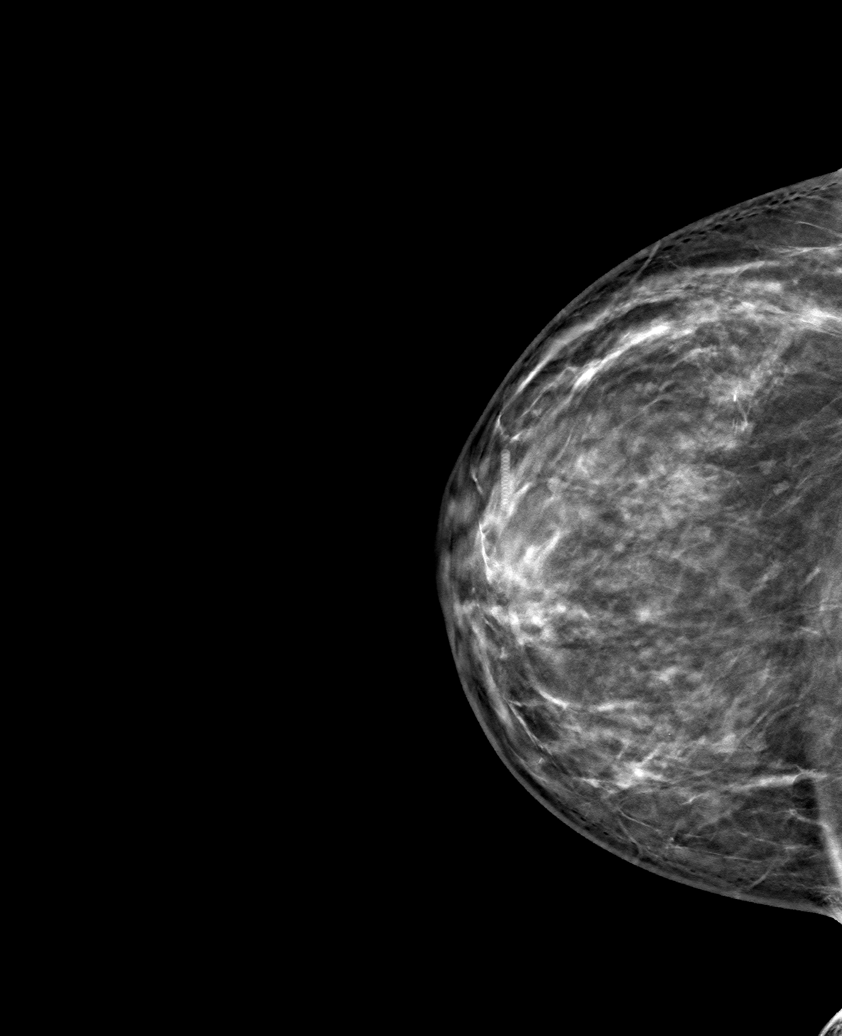

[6 of 18 positions shown; findings below may reference images not displayed]

ACR Breast Density Category c: The breast tissue is heterogeneously
dense, which may obscure small masses.
FINDINGS: Mammogram:

A skin BB marks the site of concern reported by the patient in the
Peri areolar right breast. A spot tangential view of this area is
performed in addition to standard views. There is an obscured mass
measuring approximately 0.6 cm at the palpable site. Additionally
there are 2 adjacent obscured masses in the upper outer right breast
measuring approximately 0.8 cm each.

On physical exam at the site of concern reported by the patient in
the Peri areolar right breast I do not feel a discrete mass.

Ultrasound:

Targeted ultrasound is performed at the palpable site in the right
breast at 12 o'clock retroareolar demonstrating a cluster of cysts
measuring approximately 0.8 x 0.4 x 0.4 cm.

At 11:30 o'clock 1 cm from the nipple there are adjacent oval
circumscribed anechoic masses consistent with simple cysts. One
measures 0.7 x 0.6 x 0.6 cm. The second measures 0.8 x 0.5 x 0.9 cm.
No internal vascularity.
IMPRESSION: 1. At the palpable site of concern in the right breast there is a
probably benign cluster of cysts.

2.  Benign simple cysts in the right breast at 11:30 o'clock.

RECOMMENDATION:
Right breast ultrasound in 6 months.

I have discussed the findings and recommendations with the patient
who agrees to short-term follow-up. If applicable, a reminder letter
will be sent to the patient regarding the next appointment.

BI-RADS CATEGORY  3: Probably benign.

## 2021-11-21 ENCOUNTER — Ambulatory Visit
Admission: RE | Admit: 2021-11-21 | Discharge: 2021-11-21 | Disposition: A | Discharge status: Home / Self Care | Payer: PRIVATE HEALTH INSURANCE | Source: Ambulatory Visit | Attending: Obstetrics and Gynecology | Admitting: Obstetrics and Gynecology

## 2021-11-21 DIAGNOSIS — N63 Unspecified lump in unspecified breast: Secondary | ICD-10-CM

## 2022-02-16 ENCOUNTER — Encounter: Payer: Self-pay | Admitting: *Deleted

## 2022-04-24 LAB — HM PAP SMEAR

## 2022-05-07 ENCOUNTER — Encounter: Payer: Self-pay | Admitting: *Deleted

## 2022-06-19 ENCOUNTER — Ambulatory Visit (INDEPENDENT_AMBULATORY_CARE_PROVIDER_SITE_OTHER): Payer: No Typology Code available for payment source | Admitting: Family Medicine

## 2022-06-19 ENCOUNTER — Encounter: Payer: Self-pay | Admitting: Family Medicine

## 2022-06-19 VITALS — BP 145/84 | HR 91 | Temp 98.0°F | Ht 70.0 in | Wt 185.8 lb

## 2022-06-19 DIAGNOSIS — F17210 Nicotine dependence, cigarettes, uncomplicated: Secondary | ICD-10-CM | POA: Diagnosis not present

## 2022-06-19 DIAGNOSIS — K582 Mixed irritable bowel syndrome: Secondary | ICD-10-CM | POA: Insufficient documentation

## 2022-06-19 DIAGNOSIS — D126 Benign neoplasm of colon, unspecified: Secondary | ICD-10-CM | POA: Diagnosis not present

## 2022-06-19 DIAGNOSIS — Z716 Tobacco abuse counseling: Secondary | ICD-10-CM

## 2022-06-19 DIAGNOSIS — R030 Elevated blood-pressure reading, without diagnosis of hypertension: Secondary | ICD-10-CM

## 2022-06-19 DIAGNOSIS — Z23 Encounter for immunization: Secondary | ICD-10-CM | POA: Diagnosis not present

## 2022-06-19 DIAGNOSIS — Z0000 Encounter for general adult medical examination without abnormal findings: Secondary | ICD-10-CM

## 2022-06-19 DIAGNOSIS — Z0001 Encounter for general adult medical examination with abnormal findings: Secondary | ICD-10-CM | POA: Diagnosis not present

## 2022-06-19 DIAGNOSIS — Z800 Family history of malignant neoplasm of digestive organs: Secondary | ICD-10-CM

## 2022-06-19 LAB — LIPID PANEL
Cholesterol: 211 mg/dL — ABNORMAL HIGH (ref 0–200)
HDL: 98.5 mg/dL (ref >39.00)
LDL Cholesterol: 101 mg/dL — ABNORMAL HIGH (ref 0–99)
NonHDL: 112.06
Total CHOL/HDL Ratio: 2
Triglycerides: 56 mg/dL (ref 0.0–149.0)
VLDL: 11.2 mg/dL (ref 0.0–40.0)

## 2022-06-19 LAB — CBC WITH DIFFERENTIAL/PLATELET
Basophils Absolute: 0.1 10*3/uL (ref 0.0–0.1)
Basophils Relative: 1.1 % (ref 0.0–3.0)
Eosinophils Absolute: 0.2 10*3/uL (ref 0.0–0.7)
Eosinophils Relative: 2.5 % (ref 0.0–5.0)
HCT: 41.9 % (ref 36.0–46.0)
Hemoglobin: 14.3 g/dL (ref 12.0–15.0)
Lymphocytes Relative: 28.9 % (ref 12.0–46.0)
Lymphs Abs: 2.1 10*3/uL (ref 0.7–4.0)
MCHC: 34.1 g/dL (ref 30.0–36.0)
MCV: 97.7 fl (ref 78.0–100.0)
Monocytes Absolute: 0.7 10*3/uL (ref 0.1–1.0)
Monocytes Relative: 10.1 % (ref 3.0–12.0)
Neutro Abs: 4.2 10*3/uL (ref 1.4–7.7)
Neutrophils Relative %: 57.4 % (ref 43.0–77.0)
Platelets: 282 10*3/uL (ref 150.0–400.0)
RBC: 4.29 Mil/uL (ref 3.87–5.11)
RDW: 13.4 % (ref 11.5–15.5)
WBC: 7.3 10*3/uL (ref 4.0–10.5)

## 2022-06-19 LAB — COMPREHENSIVE METABOLIC PANEL
ALT: 30 U/L (ref 0–35)
AST: 21 U/L (ref 0–37)
Albumin: 4.4 g/dL (ref 3.5–5.2)
Alkaline Phosphatase: 57 U/L (ref 39–117)
BUN: 11 mg/dL (ref 6–23)
CO2: 28 mEq/L (ref 19–32)
Calcium: 9.3 mg/dL (ref 8.4–10.5)
Chloride: 104 mEq/L (ref 96–112)
Creatinine, Ser: 0.71 mg/dL (ref 0.40–1.20)
GFR: 101.29 mL/min (ref >60.00)
Glucose, Bld: 91 mg/dL (ref 70–99)
Potassium: 3.9 mEq/L (ref 3.5–5.1)
Sodium: 140 mEq/L (ref 135–145)
Total Bilirubin: 0.5 mg/dL (ref 0.2–1.2)
Total Protein: 6.6 g/dL (ref 6.0–8.3)

## 2022-06-19 LAB — TSH: TSH: 2.15 u[IU]/mL (ref 0.35–5.50)

## 2022-06-19 NOTE — Patient Instructions (Signed)
Please return in 12 months for your annual complete physical; please come fasting.   I will release your lab results to you on your MyChart account with further instructions. You may see the results before I do, but when I review them I will send you a message with my report or have my assistant call you if things need to be discussed. Please reply to my message with any questions. Thank you!   Please check your blood pressures at home at different times and record the readings. Send me a message with the numbers. Normal BP is < 130/80, prehypertension is 130s/80s and Hypertension is > 140/90. Eat a low salt diet.   If you have any questions or concerns, please don't hesitate to send me a message via MyChart or call the office at [phone]. Thank you for visiting with [place] today! It's our pleasure caring for you.   Steps to Quit Smoking Smoking tobacco is the leading cause of preventable death. It can affect almost every organ in the body. Smoking puts you and those around you at risk for developing many serious chronic diseases. Quitting smoking can be very challenging. Do not get discouraged if you are not successful the first time. Some people need to make many attempts to quit before they achieve long-term success. Do your best to stick to your quit plan, and talk with your health care provider if you have any questions or concerns. How do I get ready to quit? When you decide to quit smoking, create a plan to help you succeed. Before you quit: Pick a date to quit. Set a date within the next 2 weeks to give you time to prepare. Write down the reasons why you are quitting. Keep this list in places where you will see it often. Tell your family, friends, and co-workers that you are quitting. Support from people you are close to can make quitting easier. Talk with your health care provider about your options for quitting smoking. Find out what treatment options are covered by your health  insurance. Identify people, places, things, and activities that make you want to smoke (triggers). Avoid them. What first steps can I take to quit smoking? Throw away all cigarettes at home, at work, and in your car. Throw away smoking accessories, such as Scientist, research (medical). Clean your car. Make sure to empty the ashtray. Clean your home, including curtains and carpets. What strategies can I use to quit smoking? Talk with your health care provider about combining strategies, such as taking medicines while you are also receiving in-person counseling. Using these two strategies together makes you more likely to succeed in quitting than if you used either strategy on its own. If you are pregnant or breastfeeding, talk with your health care provider about finding counseling or other support strategies to quit smoking. Do not take medicine to help you quit smoking unless your health care provider tells you to. Quit right away Quit smoking completely, instead of gradually reducing how much you smoke over a period of time. Stopping smoking right away may be more successful than gradually quitting. Attend in-person counseling to help you build problem-solving skills. You are more likely to succeed in quitting if you attend counseling sessions regularly. Even short sessions of 10 minutes can be effective. Take medicine You may take medicines to help you quit smoking. Some medicines require a prescription. You can also purchase over-the-counter medicines. Medicines may have nicotine in them to replace the nicotine in cigarettes. Medicines  may: Help to stop cravings. Help to relieve withdrawal symptoms. Your health care provider may recommend: Nicotine patches, gum, or lozenges. Nicotine inhalers or sprays. Non-nicotine medicine that you take by mouth. Find resources Find resources and support systems that can help you quit smoking and remain smoke-free after you quit. These resources are most helpful  when you use them often. They include: Online chats with a Social worker. Telephone quitlines. Printed Furniture conservator/restorer. Support groups or group counseling. Text messaging programs. Mobile phone apps or applications. Use apps that can help you stick to your quit plan by providing reminders, tips, and encouragement. Examples of free services include Quit Guide from the CDC and smokefree.gov  What can I do to make it easier to quit?  Reach out to your family and friends for support and encouragement. Call telephone quitlines, such as 1-800-QUIT-NOW, reach out to support groups, or work with a counselor for support. Ask people who smoke to avoid smoking around you. Avoid places that trigger you to smoke, such as bars, parties, or smoke-break areas at work. Spend time with people who do not smoke. Lessen the stress in your life. Stress can be a smoking trigger for some people. To lessen stress, try: Exercising regularly. Doing deep-breathing exercises. Doing yoga. Meditating. What benefits will I see if I quit smoking? Over time, you should start to see positive results, such as: Improved sense of smell and taste. Decreased coughing and sore throat. Slower heart rate. Lower blood pressure. Clearer and healthier skin. The ability to breathe more easily. Fewer sick days. Summary Quitting smoking can be very challenging. Do not get discouraged if you are not successful the first time. Some people need to make many attempts to quit before they achieve long-term success. When you decide to quit smoking, create a plan to help you succeed. Quit smoking right away, not slowly over a period of time. Find resources and support systems that can help you quit smoking and remain smoke-free after you quit. This information is not intended to replace advice given to you by your health care provider. Make sure you discuss any questions you have with your health care provider. Document Revised:  05/02/2021 Document Reviewed: 05/02/2021 Elsevier Patient Education  [employer].

## 2022-06-19 NOTE — Progress Notes (Signed)
Subjective  Chief Complaint  Patient presents with   Annual Exam    HPI: [name] is a 47 y.o. female who presents to [facility] [place] today for a Female Wellness Visit. She also has the concerns and/or needs as listed above in the chief complaint. These will be addressed in addition to the Health Maintenance Visit.   Wellness Visit: annual visit with health maintenance review and exam without Pap  HM sees gyn for female wellness. Pap and mammo current. Regular cycles w/o perimenopausal sxs.  Overall, doing well. Works full time as Art therapist and loves it. Married, mother. No major concerns.  CRC screens: due now. Q 5 years due to [facility] and h/o tubular adenoma. Dr. Shary Key Eligible for Tdap Chronic disease f/u and/or acute problem visit: (deemed necessary to be done in addition to the wellness visit): Smoker: only smokes after work. More habit than anything; and stress reliever. Has routine: wine/cig while cooking dinner with husband (former smoker). Or if talking on phone etc. Would like to quit. Denies sxs of lung disease.  IBS: manages behaviorally. No current sig flares. Diet related.  No h/o HTN; bp elevated today and intermittently at other ov. No cp or sob. No leg swelling. Weight is up about 10 pounds in the last few years. Diet is fair. Eats at home mostly. Little exercise.   Assessment  1. Annual physical exam   2. Nicotine dependence, cigarettes, uncomplicated   3. Tubular adenoma of colon   4. Irritable bowel syndrome with both constipation and diarrhea   5. Family history of colon cancer   6. Encounter for tobacco use cessation counseling   7. Elevated blood pressure reading without diagnosis of hypertension   8. Need for Tdap vaccination      Plan  Female Wellness Visit: Age appropriate Health Maintenance and Prevention measures were discussed with patient. Included topics are cancer screening recommendations, ways to keep healthy (see  AVS) including dietary and exercise recommendations, regular eye and dental care, use of seat belts, and avoidance of moderate alcohol use and tobacco use. Pt to set up appt for colonoscopy.  BMI: discussed patient's BMI and encouraged positive lifestyle modifications to help get to or maintain a target BMI. HM needs and immunizations were addressed and ordered. See below for orders. See HM and immunization section for updates. Tdap updated today Routine labs and screening tests ordered including cmp, cbc and lipids where appropriate. Discussed recommendations regarding Vit D and calcium supplementation (see AVS)  Chronic disease management visit and/or acute problem visit: Smoking cessation. Discussed approaches for quitting. Wellbutrin may be helpful. Discussed assessment and quit plan. Pt will strongly consider.  IBS: managing well Tubular adenoma and fam h/o cancer: to schedule screening Elevated BP: start home monitoring and low sodium diet. Will send me in numbers. Start treatment if indicated. Ses AVS  Follow up: 12 mo for cpe; sooner if bp is elevated at home or need more help with tobacco cessastion  Orders Placed This Encounter  Procedures   Tdap vaccine greater than or equal to 7yo IM   CBC with Differential/Platelet   Comprehensive metabolic panel   Lipid panel   TSH   No orders of the defined types were placed in this encounter.     Body mass index is 26.66 kg/m. Wt Readings from Last 3 Encounters:  06/19/22 185 lb 12.8 oz (84.3 kg)  03/07/20 176 lb 9.6 oz (80.1 kg)  01/18/19 170 lb 9.6  oz (77.4 kg)     Patient Active Problem List   Diagnosis Date Noted   Tubular adenoma of colon 06/19/2022   Irritable bowel syndrome with both constipation and diarrhea 06/19/2022   Nicotine dependence, cigarettes, uncomplicated 23/30/0762   Family history of colon cancer 01/18/2019   Family history of ovarian cancer 01/18/2019   Family history of breast cancer in mother  01/18/2019   History of colonoscopy 11/05/2014    Overview:  02/23/14 lymphocytic colitis, tubular adenoma (very low risk malignancy)  Per Dr Shary Key Overview:  02/23/14  Normal, no H pylori    Health Maintenance  Topic Date Due   COVID-19 Vaccine (1) Never done   MAMMOGRAM  11/22/2022   COLONOSCOPY (Pts 45-74yr Insurance coverage will need to be confirmed)  05/27/2023   PAP SMEAR-Modifier  05/24/2027   DTaP/Tdap/Td (2 - Td or Tdap) 06/19/2032   INFLUENZA VACCINE  Completed   Hepatitis C Screening  Completed   HIV Screening  Completed   HPV VACCINES  Aged Out   Immunization History  Administered Date(s) Administered   Influenza,inj,Quad PF,6+ Mos 01/18/2019, 03/07/2020, 02/21/2022   Tdap 06/19/2022   We updated and reviewed the patient's past history in detail and it is documented below. Allergies: Patient is allergic to amoxicillin-pot clavulanate, elemental sulfur, and sulfa antibiotics. Past Medical History Patient  has a past medical history of Colitis, Family history of breast cancer in mother (01/18/2019), and Family history of colon cancer (01/18/2019). Past Surgical History Patient  has a past surgical history that includes Cesarean section; Tubal ligation; Breast biopsy (Right, 12/2014); and Breast biopsy (Left, 12/2014). Family History: Patient family history includes Alcohol abuse in her maternal grandmother; Breast cancer (age of onset: 576 in her mother; Colon cancer in her sister; Healthy in her daughter; Heart disease in her maternal grandfather and maternal uncle; Hyperlipidemia in her father; Hypertension in her father; Ovarian cancer in her maternal grandmother; Stroke in her maternal grandmother. Social History:  Patient  reports that she has been smoking cigarettes. She has a 10.00 pack-year smoking history. She has never used smokeless tobacco. She reports current alcohol use. She reports that she does not use drugs.  Review of Systems: Constitutional: negative  for fever or malaise Ophthalmic: negative for photophobia, double vision or loss of vision Cardiovascular: negative for chest pain, dyspnea on exertion, or new LE swelling Respiratory: negative for SOB or persistent cough Gastrointestinal: negative for abdominal pain, change in bowel habits or melena Genitourinary: negative for dysuria or gross hematuria, no abnormal uterine bleeding or disharge Musculoskeletal: negative for new gait disturbance or muscular weakness Integumentary: negative for new or persistent rashes, no breast lumps Neurological: negative for TIA or stroke symptoms Psychiatric: negative for SI or delusions Allergic/Immunologic: negative for hives  Patient Care Team    Relationship Specialty Notifications Start End  ALeamon Arnt MD PCP - General Family Medicine  01/18/19   Meisinger, TSherren Mocha MD Consulting Physician Obstetrics and Gynecology  01/18/19   KLucienne Capers MD Referring Physician Gastroenterology  01/18/19     Objective  Vitals: BP (!) 145/84   Pulse 91   Temp 98 F (36.7 C) (Temporal)   Ht '5\' 10"'$  (1.778 m)   Wt 185 lb 12.8 oz (84.3 kg)   LMP 06/03/2022   SpO2 99%   BMI 26.66 kg/m  General:  Well developed, well nourished, no acute distress  Psych:  Alert and orientedx3,normal mood and affect HEENT:  Normocephalic, atraumatic, non-icteric sclera,  supple neck without adenopathy,  mass or thyromegaly Cardiovascular:  Normal S1, S2, RRR without gallop, rub or murmur Respiratory:  Good breath sounds bilaterally, CTAB with normal respiratory effort Gastrointestinal: normal bowel sounds, soft, non-tender, no noted masses. No HSM MSK: no deformities, contusions. Joints are without erythema or swelling.  Skin:  Warm, no rashes or suspicious lesions noted Neurologic:    Mental status is normal. CN 2-11 are normal. Gross motor and sensory exams are normal. Normal gait. No tremor   Commons side effects, risks, benefits, and alternatives for medications and  treatment plan prescribed today were discussed, and the patient expressed understanding of the given instructions. Patient is instructed to call or message via MyChart if he/she has any questions or concerns regarding our treatment plan. No barriers to understanding were identified. We discussed Red Flag symptoms and signs in detail. Patient expressed understanding regarding what to do in case of urgent or emergency type symptoms.  Medication list was reconciled, printed and provided to the patient in AVS. Patient instructions and summary information was reviewed with the patient as documented in the AVS. This note was prepared with assistance of Dragon voice recognition software. Occasional wrong-word or sound-a-like substitutions may have occurred due to the inherent limitations of voice recognition software

## 2022-12-08 ENCOUNTER — Other Ambulatory Visit: Payer: Self-pay | Admitting: Obstetrics and Gynecology

## 2022-12-08 DIAGNOSIS — N6001 Solitary cyst of right breast: Secondary | ICD-10-CM

## 2022-12-18 ENCOUNTER — Ambulatory Visit
Admission: RE | Admit: 2022-12-18 | Discharge: 2022-12-18 | Disposition: A | Discharge status: Home / Self Care | Payer: No Typology Code available for payment source | Source: Ambulatory Visit | Attending: Obstetrics and Gynecology | Admitting: Obstetrics and Gynecology

## 2022-12-18 ENCOUNTER — Other Ambulatory Visit: Payer: Self-pay | Admitting: Obstetrics and Gynecology

## 2022-12-18 DIAGNOSIS — N6001 Solitary cyst of right breast: Secondary | ICD-10-CM

## 2022-12-21 ENCOUNTER — Other Ambulatory Visit: Payer: Self-pay | Admitting: Obstetrics and Gynecology

## 2022-12-21 DIAGNOSIS — R921 Mammographic calcification found on diagnostic imaging of breast: Secondary | ICD-10-CM

## 2022-12-21 DIAGNOSIS — N6001 Solitary cyst of right breast: Secondary | ICD-10-CM

## 2022-12-25 ENCOUNTER — Other Ambulatory Visit: Payer: Self-pay | Admitting: Obstetrics and Gynecology

## 2022-12-25 ENCOUNTER — Ambulatory Visit
Admission: RE | Admit: 2022-12-25 | Discharge: 2022-12-25 | Disposition: A | Discharge status: Home / Self Care | Payer: No Typology Code available for payment source | Source: Ambulatory Visit | Attending: Obstetrics and Gynecology | Admitting: Obstetrics and Gynecology

## 2022-12-25 DIAGNOSIS — N6001 Solitary cyst of right breast: Secondary | ICD-10-CM

## 2022-12-25 DIAGNOSIS — R921 Mammographic calcification found on diagnostic imaging of breast: Secondary | ICD-10-CM

## 2022-12-25 HISTORY — PX: BREAST BIOPSY: SHX20

## 2023-01-01 ENCOUNTER — Ambulatory Visit
Admission: RE | Admit: 2023-01-01 | Discharge: 2023-01-01 | Disposition: A | Discharge status: Home / Self Care | Payer: No Typology Code available for payment source | Source: Ambulatory Visit | Attending: Obstetrics and Gynecology | Admitting: Obstetrics and Gynecology

## 2023-01-05 ENCOUNTER — Other Ambulatory Visit: Payer: Self-pay | Admitting: Obstetrics and Gynecology

## 2023-01-05 DIAGNOSIS — R9230 Dense breasts, unspecified: Secondary | ICD-10-CM

## 2023-02-13 ENCOUNTER — Other Ambulatory Visit: Payer: No Typology Code available for payment source

## 2023-02-14 ENCOUNTER — Inpatient Hospital Stay
Admission: RE | Admit: 2023-02-14 | Discharge: 2023-02-14 | Discharge status: Home / Self Care | Payer: No Typology Code available for payment source | Source: Ambulatory Visit | Attending: Obstetrics and Gynecology | Admitting: Obstetrics and Gynecology

## 2023-02-14 DIAGNOSIS — R9230 Dense breasts, unspecified: Secondary | ICD-10-CM

## 2023-02-14 MED ORDER — GADOPICLENOL 0.5 MMOL/ML IV SOLN
8.0000 mL | Freq: Once | INTRAVENOUS | Status: AC | PRN
  Administered 2023-02-14: 8 mL via INTRAVENOUS

## 2023-08-20 ENCOUNTER — Encounter: Payer: No Typology Code available for payment source | Admitting: Family Medicine

## 2023-09-03 ENCOUNTER — Ambulatory Visit: Admitting: Family Medicine

## 2023-09-03 ENCOUNTER — Encounter: Payer: Self-pay | Admitting: Family Medicine

## 2023-09-03 VITALS — BP 129/89 | HR 93 | Temp 97.9°F | Ht 70.0 in | Wt 172.0 lb

## 2023-09-03 DIAGNOSIS — R030 Elevated blood-pressure reading, without diagnosis of hypertension: Secondary | ICD-10-CM

## 2023-09-03 DIAGNOSIS — Z716 Tobacco abuse counseling: Secondary | ICD-10-CM

## 2023-09-03 DIAGNOSIS — Z13220 Encounter for screening for lipoid disorders: Secondary | ICD-10-CM

## 2023-09-03 DIAGNOSIS — I7300 Raynaud's syndrome without gangrene: Secondary | ICD-10-CM

## 2023-09-03 DIAGNOSIS — D126 Benign neoplasm of colon, unspecified: Secondary | ICD-10-CM

## 2023-09-03 DIAGNOSIS — Z800 Family history of malignant neoplasm of digestive organs: Secondary | ICD-10-CM | POA: Diagnosis not present

## 2023-09-03 DIAGNOSIS — N951 Menopausal and female climacteric states: Secondary | ICD-10-CM

## 2023-09-03 DIAGNOSIS — K582 Mixed irritable bowel syndrome: Secondary | ICD-10-CM | POA: Diagnosis not present

## 2023-09-03 DIAGNOSIS — Z23 Encounter for immunization: Secondary | ICD-10-CM

## 2023-09-03 DIAGNOSIS — Z0001 Encounter for general adult medical examination with abnormal findings: Secondary | ICD-10-CM

## 2023-09-03 DIAGNOSIS — F17210 Nicotine dependence, cigarettes, uncomplicated: Secondary | ICD-10-CM

## 2023-09-03 DIAGNOSIS — Z8041 Family history of malignant neoplasm of ovary: Secondary | ICD-10-CM | POA: Diagnosis not present

## 2023-09-03 DIAGNOSIS — Z0000 Encounter for general adult medical examination without abnormal findings: Secondary | ICD-10-CM

## 2023-09-03 LAB — CBC WITH DIFFERENTIAL/PLATELET
Basophils Absolute: 0.1 10*3/uL (ref 0.0–0.1)
Basophils Relative: 0.8 % (ref 0.0–3.0)
Eosinophils Absolute: 0.2 10*3/uL (ref 0.0–0.7)
Eosinophils Relative: 2.1 % (ref 0.0–5.0)
HCT: 43 % (ref 36.0–46.0)
Hemoglobin: 14.5 g/dL (ref 12.0–15.0)
Lymphocytes Relative: 24.7 % (ref 12.0–46.0)
Lymphs Abs: 2.3 10*3/uL (ref 0.7–4.0)
MCHC: 33.7 g/dL (ref 30.0–36.0)
MCV: 97.8 fl (ref 78.0–100.0)
Monocytes Absolute: 0.7 10*3/uL (ref 0.1–1.0)
Monocytes Relative: 7 % (ref 3.0–12.0)
Neutro Abs: 6.2 10*3/uL (ref 1.4–7.7)
Neutrophils Relative %: 65.4 % (ref 43.0–77.0)
Platelets: 319 10*3/uL (ref 150.0–400.0)
RBC: 4.4 Mil/uL (ref 3.87–5.11)
RDW: 13.8 % (ref 11.5–15.5)
WBC: 9.4 10*3/uL (ref 4.0–10.5)

## 2023-09-03 LAB — LIPID PANEL
Cholesterol: 220 mg/dL — ABNORMAL HIGH (ref 0–200)
HDL: 89.7 mg/dL (ref >39.00)
LDL Cholesterol: 119 mg/dL — ABNORMAL HIGH (ref 0–99)
NonHDL: 130.61
Total CHOL/HDL Ratio: 2
Triglycerides: 59 mg/dL (ref 0.0–149.0)
VLDL: 11.8 mg/dL (ref 0.0–40.0)

## 2023-09-03 LAB — COMPREHENSIVE METABOLIC PANEL WITH GFR
ALT: 29 U/L (ref 0–35)
AST: 23 U/L (ref 0–37)
Albumin: 4.6 g/dL (ref 3.5–5.2)
Alkaline Phosphatase: 65 U/L (ref 39–117)
BUN: 12 mg/dL (ref 6–23)
CO2: 27 meq/L (ref 19–32)
Calcium: 9.3 mg/dL (ref 8.4–10.5)
Chloride: 104 meq/L (ref 96–112)
Creatinine, Ser: 0.76 mg/dL (ref 0.40–1.20)
GFR: 92.56 mL/min (ref >60.00)
Glucose, Bld: 91 mg/dL (ref 70–99)
Potassium: 4.5 meq/L (ref 3.5–5.1)
Sodium: 139 meq/L (ref 135–145)
Total Bilirubin: 0.5 mg/dL (ref 0.2–1.2)
Total Protein: 6.7 g/dL (ref 6.0–8.3)

## 2023-09-03 LAB — TSH: TSH: 2.42 u[IU]/mL (ref 0.35–5.50)

## 2023-09-03 NOTE — Patient Instructions (Addendum)
 Please return in 12 months for your annual complete physical; please come fasting.   I will release your lab results to you on your MyChart account with further instructions. You may see the results before I do, but when I review them I will send you a message with my report or have my assistant call you if things need to be discussed. Please reply to my message with any questions. Thank you!   If you have any questions or concerns, please don't hesitate to send me a message via MyChart or call the office at [phone]. Thank you for visiting with [place] today! It's our pleasure caring for you.   VISIT SUMMARY:  Today, we discussed your Raynaud's phenomenon, irregular menstrual cycles, smoking habits, blood pressure, and general health maintenance. We also reviewed your recent screenings and vaccinations.  YOUR PLAN:  -BASAL CELL CARCINOMA: Basal cell carcinoma is a type of skin cancer that is typically slow-growing and treatable. You are scheduled for removal of the lesion by a dermatologist, and it is important to proceed with this appointment.  -RAYNAUD'S PHENOMENON: Raynaud's phenomenon is a condition where small blood vessels in your fingers and toes constrict in response to cold or stress, causing them to turn white and tingly. To manage this, keep your hands warm and monitor for any new symptoms.  -NICOTINE DEPENDENCE: Nicotine dependence is an addiction to tobacco products. You expressed a desire to quit smoking. Set a quit date, consider using nicotine replacement therapy like gum or patches, and find ways to manage stress and avoid triggers.  -PREHYPERTENSION: Prehypertension is when your blood pressure is higher than normal but not yet in the high blood pressure range. To manage this, follow a low salt diet, quit smoking, and continue to monitor your blood pressure at home.  -PERIMENOPAUSE: Perimenopause is the transition period before menopause when menstrual cycles can become  irregular. Monitor your menstrual cycle and symptoms, and follow up with a gynecologist if symptoms worsen.  -GENERAL HEALTH MAINTENANCE: You are up to date with your health screenings. Due to your smoking history and age, you should get a pneumonia vaccination. Continue with regular health screenings as recommended.  INSTRUCTIONS:  Please proceed with the scheduled removal of the basal cell carcinoma by your dermatologist. Set a quit date for smoking and consider using nicotine replacement therapy. Follow a low salt diet and continue to monitor your blood pressure at home. Keep track of your menstrual cycle and symptoms, and follow up with a gynecologist if needed. We have given you Prevnar 20 vaccination today. Continue with regular health screenings as per guidelines.  Raynaud's Phenomenon  Raynaud's phenomenon is a condition that affects the blood vessels (arteries) that carry blood to the fingers and toes. The arteries that supply blood to the ears, lips, nipples, or the tip of the nose might also be affected. Raynaud's phenomenon causes the arteries to become narrow temporarily (spasm). As a result, the flow of blood to the affected areas is temporarily decreased. This usually occurs in response to cold temperatures or stress. During an attack, the skin in the affected areas turns white, then blue, and finally red. A person may also feel tingling or numbness in those areas. Attacks usually last for only a brief period, and then the blood flow to the area returns to normal. In most cases, Raynaud's phenomenon does not cause serious health problems. What are the causes? In many cases, the cause of this condition is not known. The condition may occur  on its own (primary Raynaud's phenomenon) or may be associated with other diseases or factors (secondary Raynaud's phenomenon). Possible causes may include: Diseases or medical conditions that damage the arteries. Injuries and repetitive actions that  hurt the hands or feet. Being exposed to certain chemicals. Taking medicines that narrow the arteries. Other medical conditions, such as lupus, scleroderma, rheumatoid arthritis, thyroid problems, blood disorders, Sjogren syndrome, or atherosclerosis. What increases the risk? The following factors may make you more likely to develop this condition: Being 25-67 years old. Being female. Having a family history of Raynaud's phenomenon. Living in a cold climate. Smoking. What are the signs or symptoms? Symptoms of this condition usually occur when you are exposed to cold temperatures or when you have emotional stress. The symptoms may last for a few minutes or up to several hours. They usually affect your fingers but may also affect your toes, nipples, lips, ears, or the tip of your nose. Symptoms may include: Changes in skin color. The skin in the affected areas will turn pale or white. The skin may then change from white to bluish to red as normal blood flow returns to the area. Numbness, tingling, or pain in the affected areas. In severe cases, symptoms may include: Skin sores. Tissues decaying and dying (gangrene). How is this diagnosed? This condition may be diagnosed based on: Your symptoms and medical history. A physical exam. During the exam, you may be asked to put your hands in cold water to check for a reaction to cold temperature. Tests, such as: Blood tests to check for other diseases or conditions. A test to check the movement of blood through your arteries and veins (vascular ultrasound). A test in which the skin at the base of your fingernail is examined under a microscope (nailfold capillaroscopy). How is this treated? During an episode, you can take actions to help symptoms go away faster. Options include moving your arms around in a windmill pattern, warming your fingers under warm water, or placing your fingers in a warm body fold, such as your armpit. Long-term treatment  for this condition often involves making lifestyle changes and taking steps to control your exposure to cold temperature. For more severe cases, medicine (calcium channel blockers) may be used to improve blood circulation. Follow these instructions at home: Avoiding cold temperatures Take these steps to avoid exposure to cold: If possible, stay indoors during cold weather. When you go outside during cold weather, dress in layers and wear mittens, a hat, a scarf, and warm footwear. Wear mittens or gloves when handling ice or frozen food. Use holders for glasses or cans containing cold drinks. Let warm water run for a while before taking a shower or bath. Warm up the car before driving in cold weather. Lifestyle If possible, avoid stressful and emotional situations. Try to find ways to manage your stress, such as: Exercise. Yoga. Meditation. Biofeedback. Do not use any products that contain nicotine or tobacco. These products include cigarettes, chewing tobacco, and vaping devices, such as e-cigarettes. If you need help quitting, ask your health care provider. Avoid secondhand smoke. Limit your use of caffeine. Switch to decaffeinated coffee, tea, and soda. Avoid chocolate. Avoid vibrating tools and machinery. General instructions Protect your hands and feet from injuries, cuts, or bruises. Avoid wearing tight rings or wristbands. Wear loose fitting socks and comfortable, roomy shoes. Take over-the-counter and prescription medicines only as told by your health care provider. Where to find support Raynaud's Association: www.raynauds.org Where to find more information  [employer] of Arthritis and Musculoskeletal and Skin Diseases: www.niams.http://www.myers.net/ Contact a health care provider if: Your discomfort becomes worse despite lifestyle changes. You develop sores on your fingers or toes that do not heal. You have breaks in the skin on your fingers or toes. You have a fever. You have  pain or swelling in your joints. You have a rash. Your symptoms occur on only one side of your body. Get help right away if: Your fingers or toes turn black. You have severe pain in the affected areas. These symptoms may represent a serious problem that is an emergency. Do not wait to see if the symptoms will go away. Get medical help right away. Call your local emergency services (911 in the [place] Do not drive yourself to the hospital. Summary Raynaud's phenomenon is a condition that affects the arteries that carry blood to the fingers, toes, ears, lips, nipples, or the tip of the nose. In many cases, the cause of this condition is not known. Symptoms of this condition include changes in skin color along with numbness and tingling in the affected area. Treatment for this condition includes lifestyle changes and reducing exposure to cold temperatures. Medicines may be used for severe cases of the condition. Contact your health care provider if your condition worsens despite treatment. This information is not intended to replace advice given to you by your health care provider. Make sure you discuss any questions you have with your health care provider. Document Revised: 07/16/2020 Document Reviewed: 07/16/2020 Elsevier Patient Education  2024 [employer].

## 2023-09-03 NOTE — Progress Notes (Signed)
 Subjective  Chief Complaint  Patient presents with   Annual Exam    Pt here for Annual Exam and is currently fasting I have requested records for pap     HPI: [name] is a 48 y.o. female who presents to [facility] [place] today for a Female Wellness Visit. She also has the concerns and/or needs as listed above in the chief complaint. These will be addressed in addition to the Health Maintenance Visit.   Wellness Visit: annual visit with health maintenance review and exam  HM: sees GYN: female wellness and pap are current. CRC screen current. Sees Derm. Eating well. Weight is down. Feels good. Eye exam current. Eligible for prevnar 20.  Chronic disease f/u and/or acute problem visit: (deemed necessary to be done in addition to the wellness visit): Discussed the use of AI scribe software for clinical note transcription with the patient, who gave verbal consent to proceed.  History of Present Illness    She experiences episodes of her fingertips turning white and feeling tingly, particularly in the mornings and with temperature changes, such as getting into a car. These symptoms have been present for the past week, and although she has experienced them before, she has not sought medical attention until now. There is no associated pain or other symptoms suggestive of autoimmune disease.  Her menstrual cycle is currently 18 days late, which is unusual as her cycle is typically only 5 to 6 days off. She has a history of tubal ligation and is not concerned about pregnancy. She has been experiencing cramping without the onset of menstruation, and she is aware of the possibility of perimenopause. No hot flushes. No heavy bleeds.   She has a long history of smoking, having started at age 64, and currently smokes 5 to 6 cigarettes per day, primarily in the evenings. She wants to quit smoking and has previously considered using Wellbutrin to aid in smoking cessation. She  notes increased smoking during stress. Smoks 2-7 cig every evening. None during the day. Denies smokers cough or wheezing.   She had been monitoring her blood pressure at home but does not recall specific numbers. She has been advised that her blood pressure is in the prehypertension range. She has been working on her diet and has lost approximately 8 pounds, noting that she does not eat out often and cooks at home.  Her family history includes colon cancer in her sister and ovarian cancer in her grandmother. Her sister underwent genetic screening, which was negative. She recently had a colonoscopy and Pap smear, both of which were up to date and normal. H/o tubular adenomas.       Assessment  1. Encounter for well adult exam with abnormal findings   2. Family history of colon cancer   3. Irritable bowel syndrome with both constipation and diarrhea   4. Nicotine dependence, cigarettes, uncomplicated   5. History of colonoscopy   6. Family history of ovarian cancer   7. Encounter for smoking cessation counseling   8. Tubular adenoma of colon   9. Perimenopause   10. Raynaud's phenomenon without gangrene   11. Prehypertension   12. Need for pneumococcal 20-valent conjugate vaccination      Plan  Female Wellness Visit: Age appropriate Health Maintenance and Prevention measures were discussed with patient. Included topics are cancer screening recommendations, ways to keep healthy (see AVS) including dietary and exercise recommendations, regular eye and dental care, use of seat belts,  and avoidance of moderate alcohol use and tobacco use. Screens are current.  BMI: discussed patient's BMI and encouraged positive lifestyle modifications to help get to or maintain a target BMI. HM needs and immunizations were addressed and ordered. See below for orders. See HM and immunization section for updates. Prevnar 20 given today due to chronic smoker Routine labs and screening tests ordered including  cmp, cbc and lipids where appropriate. Discussed recommendations regarding Vit D and calcium supplementation (see AVS)  Chronic disease management visit and/or acute problem visit: Assessment and Plan    Basal cell carcinoma Scheduled for removal by dermatologist. - Proceed with scheduled removal of the lesion by dermatologist.  Raynaud's phenomenon Intermittent episodes triggered by cold. No pain or autoimmune symptoms. No medication needed currently. - Advise to keep hands warm to prevent episodes. - Monitor for any progression or associated symptoms. - see AVS for more information  Nicotine dependence Long-term smoker, desires to quit. Discussed health risks and nicotine dependence. Emphasized stress management. - Encourage setting a quit date and developing a quit plan. - Consider nicotine replacement therapy such as gum or patches. - Advise on strategies to manage stress and replace smoking habits. - Recommend avoiding triggers and changing routines to support quitting. - consider wellbutrin - discussed all negative effects of chronic smoking. - 5-10 minutes of visit on tobacco cessation counseling was done. Pt is in the contemplative phase of cessation.   Prehypertension Diastolic pressure in high 80s. No significant family history. Lifestyle modifications recommended. - Advise on low salt diet. - Encourage smoking cessation. - Monitor blood pressure at home.  Perimenopause Menstrual irregularity consistent with perimenopause. No intervention needed unless symptoms worsen. - Monitor menstrual cycle and symptoms. Rec low salt diet and smoking cessation - Follow up with gynecologist if symptoms become problematic.  General Health Maintenance Up to date with screenings. Discussed pneumonia vaccination due to smoking and age. Explained vaccine benefits and tolerability. - Administer pneumonia vaccination. - Continue regular health screenings as per guidelines.        Follow up: 12 mo for cpe  Orders Placed This Encounter  Procedures   Pneumococcal conjugate vaccine 20-valent (Prevnar 20)   HM PAP SMEAR   CBC with Differential/Platelet   Comprehensive metabolic panel with GFR   Lipid panel   TSH   No orders of the defined types were placed in this encounter.     Body mass index is 24.68 kg/m. Wt Readings from Last 3 Encounters:  09/03/23 172 lb (78 kg)  06/19/22 185 lb 12.8 oz (84.3 kg)  03/07/20 176 lb 9.6 oz (80.1 kg)     Patient Active Problem List   Diagnosis Date Noted Date Diagnosed   Perimenopause 09/03/2023    Raynaud's phenomenon without gangrene 09/03/2023    Tubular adenoma of colon 06/19/2022    Irritable bowel syndrome with both constipation and diarrhea 06/19/2022    Nicotine dependence, cigarettes, uncomplicated 01/18/2019    Family history of colon cancer 01/18/2019     sister    Family history of ovarian cancer 01/18/2019     Sister with negative genetic screening    Family history of breast cancer in mother 01/18/2019    History of colonoscopy 11/05/2014     02/23/14 lymphocytic colitis, tubular adenoma (very low risk malignancy)  Per Dr Yevonne Pax 02/23/14  EGD: Normal, no H pylori    Health Maintenance  Topic Date Due   COVID-19 Vaccine (1) 09/19/2023 (Originally 02/03/1981)   INFLUENZA VACCINE  12/24/2023  MAMMOGRAM  02/14/2024   Cervical Cancer Screening (HPV/Pap Cotest)  04/25/2027   Colonoscopy  11/13/2027   DTaP/Tdap/Td (2 - Td or Tdap) 06/19/2032   Pneumococcal Vaccine 9-68 Years old  Completed   Hepatitis C Screening  Completed   HIV Screening  Completed   HPV VACCINES  Aged Out   Meningococcal B Vaccine  Aged Out   Immunization History  Administered Date(s) Administered   Influenza,inj,Quad PF,6+ Mos 01/18/2019, 03/07/2020, 02/21/2022   PNEUMOCOCCAL CONJUGATE-20 09/03/2023   Tdap 06/19/2022   We updated and reviewed the patient's past history in detail and it is documented  below. Allergies: Patient is allergic to amoxicillin-pot clavulanate, elemental sulfur, and sulfa antibiotics. Past Medical History Patient  has a past medical history of Allergy, Colitis, Family history of breast cancer in mother (01/18/2019), Family history of colon cancer (01/18/2019), and Sleep apnea (04/2021). Past Surgical History Patient  has a past surgical history that includes Cesarean section; Tubal ligation; Breast biopsy (Right, 12/2014); Breast biopsy (Left, 12/2014); and Breast biopsy (Right, 12/25/2022). Family History: Patient family history includes Alcohol abuse in her maternal grandmother; Breast cancer (age of onset: 45) in her mother; Colon cancer in her sister; Healthy in her daughter; Heart disease in her maternal grandfather and maternal uncle; Hyperlipidemia in her father; Hypertension in her father; Ovarian cancer in her maternal grandmother; Stroke in her maternal grandmother. Social History:  Patient  reports that she has been smoking cigarettes. She started smoking about 30 years ago. She has a 25.6 pack-year smoking history. She has never used smokeless tobacco. She reports current alcohol use of about 5.0 standard drinks of alcohol per week. She reports that she does not use drugs.  Review of Systems: Constitutional: negative for fever or malaise Ophthalmic: negative for photophobia, double vision or loss of vision Cardiovascular: negative for chest pain, dyspnea on exertion, or new LE swelling Respiratory: negative for SOB or persistent cough Gastrointestinal: negative for abdominal pain, change in bowel habits or melena Genitourinary: negative for dysuria or gross hematuria, no abnormal uterine bleeding or disharge Musculoskeletal: negative for new gait disturbance or muscular weakness Integumentary: negative for new or persistent rashes, no breast lumps Neurological: negative for TIA or stroke symptoms Psychiatric: negative for SI or  delusions Allergic/Immunologic: negative for hives  Patient Care Team    Relationship Specialty Notifications Start End  Willow Ora, MD PCP - General Family Medicine  01/18/19   Lavina Hamman, MD Consulting Physician Obstetrics and Gynecology  01/18/19   Zachery Dakins, MD Referring Physician Gastroenterology  01/18/19     Objective  Vitals: BP 129/89   Pulse 93   Temp 97.9 F (36.6 C)   Ht 5\' 10"  (1.778 m)   Wt 172 lb (78 kg)   SpO2 98%   BMI 24.68 kg/m  General:  Well developed, well nourished, no acute distress  Psych:  Alert and orientedx3,normal mood and affect HEENT:  Normocephalic, atraumatic, non-icteric sclera,  supple neck without adenopathy, mass or thyromegaly Cardiovascular:  Normal S1, S2, RRR without gallop, rub or murmur Respiratory:  Good breath sounds bilaterally, CTAB with normal respiratory effort Gastrointestinal: normal bowel sounds, soft, non-tender, no noted masses. No HSM MSK: extremities without edema, joints without erythema or swelling Neurologic:    Mental status is normal.  Gross motor and sensory exams are normal.  No tremor  Commons side effects, risks, benefits, and alternatives for medications and treatment plan prescribed today were discussed, and the patient expressed understanding of the given instructions. Patient  is instructed to call or message via MyChart if he/she has any questions or concerns regarding our treatment plan. No barriers to understanding were identified. We discussed Red Flag symptoms and signs in detail. Patient expressed understanding regarding what to do in case of urgent or emergency type symptoms.  Medication list was reconciled, printed and provided to the patient in AVS. Patient instructions and summary information was reviewed with the patient as documented in the AVS. This note was prepared with assistance of Dragon voice recognition software. Occasional wrong-word or sound-a-like substitutions may have occurred due  to the inherent limitations of voice recognition software

## 2023-09-13 ENCOUNTER — Encounter: Payer: Self-pay | Admitting: Family Medicine

## 2023-09-13 NOTE — Progress Notes (Signed)
 Labs reviewed.  The 10-year ASCVD risk score (Arnett DK, et al., 2019) is: 1.9%   Values used to calculate the score:     Age: 48 years     Sex: Female     Is Non-Hispanic African American: No     Diabetic: No     Tobacco smoker: Yes     Systolic Blood Pressure: 129 mmHg     Is BP treated: No     HDL Cholesterol: 89.7 mg/dL     Total Cholesterol: 220 mg/dL  Dear Ms. [name], Thank you for allowing me to care for you at your recent office visit.  I wanted to let you know that I have reviewed your lab test results and am happy to report that they are all in range.  They look good. Now good luck with quitting smoking! Let me know if I can help more.   Sincerely, Dr. Jonelle Neri

## 2024-02-02 ENCOUNTER — Other Ambulatory Visit: Payer: Self-pay | Admitting: Obstetrics and Gynecology

## 2024-02-02 DIAGNOSIS — Z1231 Encounter for screening mammogram for malignant neoplasm of breast: Secondary | ICD-10-CM

## 2024-02-10 ENCOUNTER — Ambulatory Visit
Admission: RE | Admit: 2024-02-10 | Discharge: 2024-02-10 | Disposition: A | Discharge status: Home / Self Care | Source: Ambulatory Visit | Attending: Obstetrics and Gynecology | Admitting: Obstetrics and Gynecology

## 2024-02-10 DIAGNOSIS — Z1231 Encounter for screening mammogram for malignant neoplasm of breast: Secondary | ICD-10-CM

## 2024-09-08 ENCOUNTER — Encounter: Admitting: Family Medicine
